# Patient Record
Sex: Male | Born: 1960 | Race: White | Hispanic: No | Marital: Married | Smoking: Never smoker
Health system: Southern US, Community
[De-identification: ages and names within clinical notes are randomized; demographics above are authoritative.]

## PROBLEM LIST (undated history)

## (undated) DIAGNOSIS — E119 Type 2 diabetes mellitus without complications: Secondary | ICD-10-CM

## (undated) DIAGNOSIS — I10 Essential (primary) hypertension: Secondary | ICD-10-CM

## (undated) DIAGNOSIS — C61 Malignant neoplasm of prostate: Secondary | ICD-10-CM

## (undated) DIAGNOSIS — E291 Testicular hypofunction: Secondary | ICD-10-CM

## (undated) DIAGNOSIS — Z86718 Personal history of other venous thrombosis and embolism: Secondary | ICD-10-CM

## (undated) DIAGNOSIS — G4730 Sleep apnea, unspecified: Secondary | ICD-10-CM

## (undated) DIAGNOSIS — R0602 Shortness of breath: Secondary | ICD-10-CM

## (undated) DIAGNOSIS — R972 Elevated prostate specific antigen [PSA]: Secondary | ICD-10-CM

## (undated) DIAGNOSIS — M549 Dorsalgia, unspecified: Secondary | ICD-10-CM

## (undated) DIAGNOSIS — M2550 Pain in unspecified joint: Secondary | ICD-10-CM

## (undated) DIAGNOSIS — I2699 Other pulmonary embolism without acute cor pulmonale: Secondary | ICD-10-CM

## (undated) DIAGNOSIS — E785 Hyperlipidemia, unspecified: Secondary | ICD-10-CM

## (undated) DIAGNOSIS — R600 Localized edema: Secondary | ICD-10-CM

## (undated) HISTORY — DX: Essential (primary) hypertension: I10

## (undated) HISTORY — PX: WRIST SURGERY: SHX841

## (undated) HISTORY — DX: Type 2 diabetes mellitus without complications: E11.9

## (undated) HISTORY — DX: Hyperlipidemia, unspecified: E78.5

## (undated) HISTORY — DX: Shortness of breath: R06.02

## (undated) HISTORY — DX: Localized edema: R60.0

## (undated) HISTORY — DX: Pain in unspecified joint: M25.50

## (undated) HISTORY — PX: JOINT REPLACEMENT: SHX530

## (undated) HISTORY — DX: Personal history of other venous thrombosis and embolism: Z86.718

## (undated) HISTORY — DX: Elevated prostate specific antigen (PSA): R97.20

## (undated) HISTORY — DX: Dorsalgia, unspecified: M54.9

## (undated) HISTORY — DX: Malignant neoplasm of prostate: C61

## (undated) HISTORY — DX: Other pulmonary embolism without acute cor pulmonale: I26.99

## (undated) HISTORY — DX: Testicular hypofunction: E29.1

## (undated) HISTORY — DX: Sleep apnea, unspecified: G47.30

## (undated) HISTORY — DX: Morbid (severe) obesity due to excess calories: E66.01

## (undated) HISTORY — PX: FOREARM SURGERY: SHX651

## (undated) HISTORY — PX: SHOULDER SURGERY: SHX246

---

## 2012-12-20 DIAGNOSIS — I2699 Other pulmonary embolism without acute cor pulmonale: Secondary | ICD-10-CM

## 2012-12-20 HISTORY — DX: Other pulmonary embolism without acute cor pulmonale: I26.99

## 2013-05-16 ENCOUNTER — Inpatient Hospital Stay: Payer: Self-pay | Admitting: Internal Medicine

## 2013-05-16 LAB — COMPREHENSIVE METABOLIC PANEL
Anion Gap: 7 (ref 7–16)
Chloride: 106 mmol/L (ref 98–107)
Co2: 28 mmol/L (ref 21–32)
EGFR (Non-African Amer.): >60
Osmolality: 283 (ref 275–301)
SGPT (ALT): 38 U/L (ref 12–78)
Total Protein: 6.9 g/dL (ref 6.4–8.2)

## 2013-05-16 LAB — PRO B NATRIURETIC PEPTIDE: B-Type Natriuretic Peptide: 65 pg/mL (ref 0–125)

## 2013-05-16 LAB — CBC
HCT: 42.3 % (ref 40.0–52.0)
MCH: 29.1 pg (ref 26.0–34.0)
MCV: 86 fL (ref 80–100)
Platelet: 214 10*3/uL (ref 150–440)
RBC: 4.91 10*6/uL (ref 4.40–5.90)
RDW: 14.4 % (ref 11.5–14.5)
WBC: 11.9 10*3/uL — ABNORMAL HIGH (ref 3.8–10.6)

## 2013-05-16 LAB — TROPONIN I: Troponin-I: 0.06 ng/mL — ABNORMAL HIGH

## 2013-05-16 LAB — PROTIME-INR
INR: 1.1
Prothrombin Time: 14.1 secs (ref 11.5–14.7)

## 2013-05-16 LAB — CK TOTAL AND CKMB (NOT AT ARMC)
CK, Total: 90 U/L (ref 35–232)
CK, Total: 84 U/L (ref 35–232)
CK-MB: 0.9 ng/mL (ref 0.5–3.6)

## 2013-05-16 LAB — APTT: Activated PTT: >160 secs (ref 23.6–35.9)

## 2013-05-17 LAB — BASIC METABOLIC PANEL
Anion Gap: 6 — ABNORMAL LOW (ref 7–16)
BUN: 15 mg/dL (ref 7–18)
Calcium, Total: 8.4 mg/dL — ABNORMAL LOW (ref 8.5–10.1)
Chloride: 103 mmol/L (ref 98–107)
Creatinine: 1.36 mg/dL — ABNORMAL HIGH (ref 0.60–1.30)
EGFR (African American): >60
EGFR (Non-African Amer.): >60
Glucose: 105 mg/dL — ABNORMAL HIGH (ref 65–99)
Osmolality: 282 (ref 275–301)

## 2013-05-17 LAB — PROTIME-INR
INR: 1.1
Prothrombin Time: 14.6 secs (ref 11.5–14.7)

## 2013-05-17 LAB — CBC WITH DIFFERENTIAL/PLATELET
Basophil #: 0 10*3/uL (ref 0.0–0.1)
Basophil %: 0.4 %
HGB: 13.4 g/dL (ref 13.0–18.0)
MCHC: 34.7 g/dL (ref 32.0–36.0)
MCV: 87 fL (ref 80–100)
Monocyte %: 9.9 %
Neutrophil #: 6.2 10*3/uL (ref 1.4–6.5)
Neutrophil %: 67.8 %
Platelet: 197 10*3/uL (ref 150–440)

## 2013-05-17 LAB — TROPONIN I: Troponin-I: 0.17 ng/mL — ABNORMAL HIGH

## 2013-05-17 LAB — CK TOTAL AND CKMB (NOT AT ARMC)
CK, Total: 75 U/L (ref 35–232)
CK-MB: 2 ng/mL (ref 0.5–3.6)

## 2013-05-18 LAB — PROTIME-INR
INR: 1.3
Prothrombin Time: 15.9 secs — ABNORMAL HIGH (ref 11.5–14.7)

## 2013-05-19 LAB — PROTIME-INR
INR: 1.7
Prothrombin Time: 20.1 secs — ABNORMAL HIGH (ref 11.5–14.7)

## 2013-05-20 LAB — PROTIME-INR
INR: 1.9
INR: 1.9
Prothrombin Time: 21.6 secs — ABNORMAL HIGH (ref 11.5–14.7)
Prothrombin Time: 21.4 secs — ABNORMAL HIGH (ref 11.5–14.7)

## 2013-05-22 LAB — CULTURE, BLOOD (SINGLE)

## 2015-04-11 NOTE — H&P (Signed)
PATIENT NAME:  Dean [name], ACKLIN MR#:  [id number] DATE OF BIRTH:  06-Jan-1961  DATE OF ADMISSION:  05/16/2013  PRIMARY DOCTOR:  Dr. Yves Dill.     ER PHYSICIAN: Dr. Mariea Clonts.   CHIEF COMPLAINT: Shortness of breath.  HISTORY OF PRESENT ILLNESS:  This is a 54 year old obese male with history of hypogonadism on testosterone pill who came in because of sudden onset of shortness of breath.  Shortness of breath started this morning. The patient woke up with shortness of breath and wheezing and cough, unable to catch the breath.  In the ER, the patient's oxygen saturation was 84% on arrival on 2 liters, and he is saturating at around 95 on 5 liters.  No chest pain. No fever. No recent history of cold or cough. No recent history of travel on a long flight.  No extremity edema. No calf pain. The patient's CAT scan of the chest showed bilateral pulmonary emboli and we are admitting him for PE.   PAST MEDICAL HISTORY: Significant for hypogonadism. The patient is on testosterone pellets which were inserted by Dr. Yves Dill 2 weeks ago. The patient denies any other medical problems.  ALLERGIES: PENICILLIN.   SOCIAL HISTORY: Occasional drinking. No smoking. The patient is a Careers adviser at [employer] and he is in the process of moving to [place] next week to start a new job, but the patient is very active. Denies any recent sedentary habits.   PAST SURGICAL HISTORY:  shoulder surgery on the right shoulder and also left elbow.   FAMILY HISTORY: No history of blood clots.  Father had hypertension.  MEDICATION:  None except the testosterone pellets which were inserted 3 to 4 weeks ago.   REVIEW OF SYSTEMS: GENERAL:  The patient denies any fever or fatigue.   EYES: No blurred vision.   ENT: No tinnitus. No epistaxis. No difficulty swallowing.   RESPIRATIONS:  The patient had sudden onset of shortness of breath and also some cough and wheezing this morning. No hemoptysis. No history of chronic obstructive  pulmonary disease.   CARDIOVASCULAR: No chest pain. No orthopnea. No PND, no pedal edema. No palpitations.   GASTROINTESTINAL: No nausea. No vomiting.   GENITOURINARY: No dysuria.   ENDOCRINE: No polyuria or nocturia.   HEMATOLOGIC: No anemia or easy bruising.   INTEGUMENTARY: No skin rashes.   MUSCULOSKELETAL: No joint pain.   NEUROLOGIC: No numbness or weakness.   PSYCHIATRIC: No anxiety or insomnia.   PHYSICAL EXAMINATION: GENERAL: This is a 54 year old male who is well developed, well nourished.   His BMI is 43.9 indicating he is morbidly obese.  VITAL SIGNS:  Temperature 97.9, heart rate 133 on arrival, respiration 26. The patient's blood pressure 165/122, sats initially 84% on room air. Right now, he is on 5 liters, saturating around 95%.   HEENT:  Head normocephalic, atraumatic. Eyes, EOM are intact. No conjunctival pallor. Nose, no lesions. No drainage.   EARS: No drainage. Mouth no lesions. No exudates.   NECK: Supple. No JVD. No masses. No lymphadenopathy. Thyroid is in the midline.   RESPIRATORY: Clear to auscultation except faint expiratory wheeze in the left upper lobe and the patient is not using accessory muscles of respiration.   CARDIOVASCULAR: S1 and S2 regular, tachycardic. Pulses equal at carotid and femoral and pedal pulses. No pedal edema.   GASTROINTESTINAL: Abdomen is soft, nontender, nondistended. Bowel sounds present. No organomegaly.   SKIN: Normal, well hydrated.   MUSCULOSKELETAL: The patient has no joint effusion and no tenderness and  range of motion is adequate.   NEUROLOGIC: Cranial nerves II through XII intact. Deep tendon reflexes 2+ bilaterally.  Sensations are intact.   PSYCHIATRIC: Mood and affect are within normal limits.   LABORATORY, DIAGNOSTIC AND RADIOLOGICAL DATA: TEX upper lobe.  Chest x-ray is concerning for atelectasis versus infiltrate in the left mid hemithorax. WBC 11.9, hemoglobin 14.3, hematocrit 42.3, and platelets 214.  Troponin 0.06.  Electrolytes: Sodium 141, potassium 3.6, chloride 106, bicarbonate 28, BUN is 14, creatinine 1.25, glucose 129. BNP 65. EKG sinus tach with 130 beats per minute. No ST-T changes.   ASSESSMENT AND PLAN: A 54 year old male with sudden onset of shortness of breath with hypoxia on room air and tachycardia and CT chest showing pulmonary emboli. Admit to hospitalist service, start the patient on Lovenox, Coumadin and also continue oxygen. Discussed the risks and benefits of anticoagulation. The patient is recently started on testosterone supplements probably his thromboembolic events likely related to the testosterone use.  We will have urology, Dr. Yves Dill, see the patient and probably will have to discontinue that. We will also get a hypercoagulation workup with protein C, protein S, antithrombin III.  Check the lower extremity ultrasound as well.   Possible pneumonia with leukocytosis. The patient received Levaquin in the ER. We will continue Levaquin and follow the trend.  We will get the pulmonary consult as the CT chest is  concerning for possible malignancy.   TIME SPENT: About 60 minutes.   Discussed the plan with the patient and patient's wife.    ____________________________ Epifanio Lesches, MD sk:rw D: 05/16/2013 14:10:00 ET T: 05/16/2013 15:12:12 ET JOB#: [id number]  cc: Otelia Limes. Yves Dill, MD Epifanio Lesches, MD, <Dictator> Epifanio Lesches MD ELECTRONICALLY SIGNED 07/02/2013 19:39

## 2015-04-11 NOTE — Consult Note (Signed)
Brief Consult Note: Diagnosis: Elevated TNI.   Patient was seen by consultant.   Consult note dictated.   Orders entered.   Comments: Patient presented with acute SOB, decreased O2sats, tachycardia and found to have PE-now on warfarin and Lovenox. His mildly elevated TNI likely are from demand ischemia from repsiratory distress and not from ischemia. Will get echo to check wall motion and make further recommendations after reviewing results.  Electronic Signatures: Angelica Ran (MD)   (Signed 31-May-14 10:45)  Co-Signer: Brief Consult Note Merla Riches (PA-C)   (Signed 29-May-14 08:43)  Authored: Brief Consult Note  Last Updated: 31-May-14 10:45 by Angelica Ran (MD)

## 2015-04-11 NOTE — Consult Note (Signed)
PATIENT NAME:  Dean [name], Dean [name] MR#:  [id number] DATE OF BIRTH:  Dec 11, 1961  DATE OF CONSULTATION:  05/17/2013  REFERRING PHYSICIAN:  Epifanio Lesches, MD CONSULTING PHYSICIAN:  Merla Riches, PA-C  PRIMARY CARE PHYSICIAN:  Calla Kicks, MD  REASON FOR CONSULTATION: Elevated troponin I.  HISTORY OF PRESENT ILLNESS: Mr. Dean [name] is a 54 year old obese male with a history of hypogonadism on testosterone supplementation who came in with sudden shortness of breath. The patient states he has had increased shortness of breath with wheezing at night, coughing and could not get his breath. He did not have any exertional chest pain prior to this episode and denies any orthopnea, PND, edema, palpitations, jaw pain, arm pain, fainting/syncopal episodes or dizziness. He has never had any significant cardiac evaluations. When he had CT scan here he was found to have pulmonary emboli (bilateral).   PAST MEDICAL HISTORY: Hypogonadism.   PAST SURGICAL HISTORY: Multiple orthopedic surgeries on the right shoulder, left elbow.   ALLERGIES: PENICILLIN.   HOME MEDICATIONS: Testosterone pellets inserted 3 to 4 weeks ago.   FAMILY HISTORY: Father had a history of CVA, hypertension, prostate cancer.   SOCIAL HISTORY: The patient works at [employer].  Denies any significant alcohol use. Denies drug use.   REVIEW OF SYSTEMS:  GENERAL: The patient denies any fever, fatigue.  EYES: No blurred vision.  ENT:  The patient denies any tinnitus, epistaxis. RESPIRATORY:  The patient with shortness of breath, coughing and wheezing.  CARDIOVASCULAR: The patient denies any exertional chest pain, palpitations. GASTROINTESTINAL:  The patient denies any nausea, vomiting, abdominal pain.   PHYSICAL EXAMINATION: GENERAL: This is a 54 year old white male who is well-developed, well-nourished and overweight.  VITAL SIGNS: Temperature 97.9 degrees Fahrenheit, heart rate is 101, respiratory rate is 20, blood pressure  101/73 and O2 sats 96% on 5 L/min.  HEENT: Head atraumatic, normocephalic. Eyes:  Pupils are round and equal. Conjunctivae pale, pink. There is no scleral icterus. Ears and nose are normal to external inspection. Mouth:  Good dentition. Moist mucous membranes.  NECK: Is supple. Trachea is midline. Thyroid smooth. There are no carotid bruits.  LUNGS: Clear to auscultation, but diminished due to the patient's girth.  HEART:  Regular rate and rhythm with some tachycardia noted.  EXTREMITIES: Trace pedal edema. The patient has TED stockings.   ANCILLARY DATA: EKG on admission was sinus tachycardia, 130 beats per minute, nonspecific ST-T changes.   CT of the chest for PE: With bilateral pulmonary emboli, possible underlying infiltrate versus malignancy in the posterior left lateral lobe inferior along the major fissure.   Doppler ultrasound of the lower extremities:  No evidence of DVT.  LABORATORY DATA: Glucose is 105, BUN 15, creatinine 1.36, sodium 141, potassium 3.9, chloride 103, CO2 is 32.  Estimated GFR is greater than 60. Total CK is 75, CK-MB is 2.0.  Troponin I on admission is 0.06, second was 0.31 and third was 0.17. White blood cell count is 9.1, hemoglobin 13.4, hematocrit 38.6, platelet count 197,000, MCV is 87. PT is 14.6. INR is 1.1.  Activated PTT is greater than 160.   ASSESSMENT AND PLAN: 1.  Elevated troponin I. The patient's elevations in troponin are most likely secondary to demand ischemia from respiratory distress since he has bilateral pulmonary emboli.  The patient denies any exertional chest pain at this time and EKG with no acute changes. Echocardiogram will be ordered to assess wall motion and further recommendations will be made at that time. The patient will  most likely need outpatient stress testing after his acute respiratory symptoms have resolved.  2.  Bilateral pulmonary emboli.  The patient is currently on anticoagulation with Lovenox and warfarin. Ultrasound of the  lower extremities are negative for deep vein thrombosis bilaterally. Hypercoagulability work-up has already been ordered.   Thank you very much for this consultation and allowing [place] to participate in this patient's care. We will continue to follow this patient with you.   ____________________________ Merla Riches, PA-C mam:sb D: 05/17/2013 09:06:49 ET T: 05/17/2013 09:59:34 ET JOB#: [id number]  cc: Merla Riches, PA-C, <Dictator> Dory Horn. Eliberto Ivory, [place] PA ELECTRONICALLY SIGNED 05/17/2013 11:27

## 2015-04-11 NOTE — Discharge Summary (Signed)
PATIENT NAME:  Dean [name], Dean [name] MR#:  [id number] DATE OF BIRTH:  10/26/1961  DATE OF ADMISSION:  05/16/2013 DATE OF DISCHARGE:  05/20/2013  DISCHARGE DIAGNOSES:  1.  Acute respiratory failure secondary to bilateral pulmonary emboli.  2.  Hypertension.  3.  Hypogonadism.   CONSULTATIONS: Pulmonology consult with Dr. Raul Del.   DISCHARGE MEDICATIONS: 1.  Metoprolol 25 mg p.o. twice daily. 2.  Levaquin 500 mg p.o. daily for 5 days.  3.  Coumadin 10 mg p.o. daily.   The patient has appointment with Dr. Hortencia Pilar on Tuesday, June 3rd at 1 p.m. He will need to follow up regarding PT and INR and adjustment of warfarin. The patient also needs to follow up with Dr. Raul Del as an outpatient regarding his CT chest findings and probably repeat a CT, but the patient is in the process of moving to [place].   HOSPITAL COURSE:  1.  [age]-year-old obese male with a history of hypogonadism, came in because of onset of shortness of breath with pleuritic chest pain, oxygen saturations 84% on room air when he came. The patient's CT chest showed pulmonary emboli bilaterally, and the patient was started on Lovenox and Coumadin. Admitted to telemetry. Patient's past medical history is significant for a history of testosterone pellets, placed by Dr. Yves Dill 2 weeks ago. The patient was started on Lovenox and Coumadin after discussing the risks and benefits. INR today is 1.9, but he has been getting Lovenox 1 mg/kg body weight, and he is on Levaquin, so hopefully the INR will jump to more than 2 tomorrow. The patient was really eager to go home, so he got the morning dose of Lovenox shot and I discharged him with Coumadin 10 mg. The patient initially was hypoxic, needing 5 liters of oxygen but gradually he was back to room air and oxygenation is 95%. Patient's lower extremity ultrasound did not show any DVT. Thought that moderate obesity and also the testosterone pellets probably are the risk factors for his DVT.  Hypercoagulable workup, including antithrombin III, protein C are within normal range.  2.  Hypertension: No prior history of high blood pressure. Blood pressure has been running around 154/90, and the patient was started on metoprolol 25 mg twice daily and advised to continue that.  3.  Possible pneumonia on the chest CAT scan: Initially the white count was slightly up at 11.9. The patient's blood cultures have been negative. Chest x-ray concerning for atelectasis versus infiltrate in the left mid-hemithorax so we started him on Levaquin. Dr. Raul Del saw the patient. Patient's CT chest showed bilateral PE's with slightly greater amount on the right, especially in the middle lobe, and also possible underlying infiltrate> in the posterolateral left upper lobe inferiorly around a major fissure. Dr. Raul Del thought it was pneumonia and the patient needs to follow up with him as an outpatient for a repeat chest CT. The patient has noticed no risk factors except obesity. He is not a smoker. No family history of cancers.  4.  Elevated troponins, likely secondary to demand ischemia from PE: The patient's troponin was up at 0.31 on admission, with normal CK and CPK-MB. The patient had no chest pain after starting Lovenox and Coumadin. He had an echocardiogram done which showed an EF more than 55%. Second troponin dropped to 0.17.   CONDITION AT THE TIME OF DISCHARGE: Stable.   The patient was able to go home. Explained that he needs to follow up with Dr. Hortencia Pilar  on Tuesday regarding INR  and Coumadin adjustment. The patient is in the process of moving to [place] for a new job, and he understands about the complaints and also getting set up with a  new doctor there, and Dr. Yves Dill also said testosterone pellets usually last for 3 months, and the patient's testosterone effect should clear away in 3 months, and he did not recommend taking them off because he is already on anticoagulants, taking th implants they  cause significant bleeding, and anyway he was started on anticoagulation. The patient understands that and we left those testosterone pellets there in his body.  The patient's wife said they tried the AndroGel, and other topical products did not help him with hypogonadism.   Time spent on discharge preparation:  More than 30 minutes.   ____________________________ Epifanio Lesches, MD sk:dm D: 05/20/2013 19:27:37 ET T: 05/21/2013 07:30:40 ET JOB#: [id number]  cc: Epifanio Lesches, MD, <Dictator> Epifanio Lesches MD ELECTRONICALLY SIGNED 05/27/2013 13:22

## 2016-01-05 ENCOUNTER — Ambulatory Visit (INDEPENDENT_AMBULATORY_CARE_PROVIDER_SITE_OTHER): Payer: BC Managed Care – PPO | Admitting: Family Medicine

## 2016-01-05 ENCOUNTER — Encounter: Payer: Self-pay | Admitting: Family Medicine

## 2016-01-05 VITALS — BP 144/92 | Ht 78.0 in | Wt >= 6400 oz

## 2016-01-05 DIAGNOSIS — Z7189 Other specified counseling: Secondary | ICD-10-CM

## 2016-01-05 DIAGNOSIS — I10 Essential (primary) hypertension: Secondary | ICD-10-CM

## 2016-01-05 DIAGNOSIS — Z7689 Persons encountering health services in other specified circumstances: Secondary | ICD-10-CM

## 2016-01-05 LAB — COMPREHENSIVE METABOLIC PANEL
ALBUMIN: 4.1 g/dL (ref 3.6–5.1)
ALT: 32 U/L (ref 9–46)
AST: 21 U/L (ref 10–35)
Alkaline Phosphatase: 64 U/L (ref 40–115)
BUN: 17 mg/dL (ref 7–25)
CHLORIDE: 99 mmol/L (ref 98–110)
CO2: 28 mmol/L (ref 20–31)
CREATININE: 1.14 mg/dL (ref 0.70–1.33)
Calcium: 9.1 mg/dL (ref 8.6–10.3)
Glucose, Bld: 91 mg/dL (ref 65–99)
POTASSIUM: 3.7 mmol/L (ref 3.5–5.3)
SODIUM: 137 mmol/L (ref 135–146)
Total Bilirubin: 0.6 mg/dL (ref 0.2–1.2)
Total Protein: 6.1 g/dL (ref 6.1–8.1)

## 2016-01-05 MED ORDER — HYDROCHLOROTHIAZIDE 25 MG PO TABS: 25.0000 mg | ORAL_TABLET | Freq: Every day | ORAL | Status: DC

## 2016-01-05 NOTE — Progress Notes (Signed)
   Subjective:    Patient ID: Dean [name], male    DOB: Oct 13, 1961, 55 y.o.   MRN: [id number]  HPI Chief Complaint  Patient presents with  . new pt    new pt get est. needs bp med refills and would like blood work.    He is new to the practice and here to establish care. He is also here for an acute problem. He has history of hypertension and has been out of blood pressure medication for about 6 months. He reports having occasional headaches and is curious if this is related to his blood pressure being elevated.  Past medical history includes multiple orthopedic injuries and shoulder surgeries including a total shoulder. Also reports history of PE, thinks it was from blockage and testicle procedure in 2014. No issues since.  Last physical exam was January 2014. Last fasting blood work then also.  Denies fever, chills, fatigue, dizziness, chest pain, palpitations, DOE, LE edema.   Denies smoking, occasional alcohol, denies drug use.    Review of Systems Pertinent positives and negatives in the history of present illness.    Objective:   Physical Exam BP 144/92 mmHg  Ht 6\' 6"  (1.981 m)  Wt 435 lb 12.8 oz (197.678 kg)  BMI 50.37 kg/m2  Alert and in no distress. Tympanic membranes and canals are normal. Pharyngeal area is normal. Neck is supple without adenopathy or thyromegaly. Cardiac exam shows a regular sinus rhythm without murmurs or gallops. Lungs are clear to auscultation.      Assessment & Plan:  Essential hypertension - Plan: Comprehensive metabolic panel  Encounter to establish care  Discussed lifestyle modifications to reduce blood pressure including DASH diet, eating healthy diet, increasing physical activity. HCTZ refilled, he states this worked well controlling his blood pressure in past but he has been without medication for 6 months at least. Recommend as he is restarting his medication that he be aware of any side effects for the first few days. Will check kidney  function today.  He will return next week for nurse visit to have his blood pressure checked. Encouraged him to schedule an appointment within the next 2 weeks for complete physical exam and fasting blood work.

## 2016-01-05 NOTE — Patient Instructions (Addendum)
Return within the next 2 weeks for a complete physical exam and fasting blood work so that we may also check your blood pressure. Since you are restarting your blood pressure medication, you should be aware of any side effects and let us know if you are experiencing low blood pressure, lightheaded or dizziness. You may want to change position slowly for the  first 2 or 3 days that you are taking the medication. You may notice an increase in urination so make sure you are staying well hydrated.   DASH Eating Plan DASH stands for "Dietary Approaches to Stop Hypertension." The DASH eating plan is a healthy eating plan that has been shown to reduce high blood pressure (hypertension). Additional health benefits may include reducing the risk of type 2 diabetes mellitus, heart disease, and stroke. The DASH eating plan may also help with weight loss. WHAT DO I NEED TO KNOW ABOUT THE DASH EATING PLAN? For the DASH eating plan, you will follow these general guidelines:  Choose foods with a percent daily value for sodium of less than 5% (as listed on the food label).  Use salt-free seasonings or herbs instead of table salt or sea salt.  Check with your health care provider or pharmacist before using salt substitutes.  Eat lower-sodium products, often labeled as "lower sodium" or "no salt added."  Eat fresh foods.  Eat more vegetables, fruits, and low-fat dairy products.  Choose whole grains. Look for the word "whole" as the first word in the ingredient list.  Choose fish and skinless chicken or [place] more often than red meat. Limit fish, poultry, and meat to 6 oz (170 g) each day.  Limit sweets, desserts, sugars, and sugary drinks.  Choose heart-healthy fats.  Limit cheese to 1 oz (28 g) per day.  Eat more home-cooked food and less restaurant, buffet, and fast food.  Limit fried foods.  Cook foods using methods other than frying.  Limit canned vegetables. If you do use them, rinse them well  to decrease the sodium.  When eating at a restaurant, ask that your food be prepared with less salt, or no salt if possible. WHAT FOODS CAN I EAT? Seek help from a dietitian for individual calorie needs. Grains Whole grain or whole wheat bread. Brown rice. Whole grain or whole wheat pasta. Quinoa, bulgur, and whole grain cereals. Low-sodium cereals. Corn or whole wheat flour tortillas. Whole grain cornbread. Whole grain crackers. Low-sodium crackers. Vegetables Fresh or frozen vegetables (raw, steamed, roasted, or grilled). Low-sodium or reduced-sodium tomato and vegetable juices. Low-sodium or reduced-sodium tomato sauce and paste. Low-sodium or reduced-sodium canned vegetables.  Fruits All fresh, canned (in natural juice), or frozen fruits. Meat and Other Protein Products Ground beef (85% or leaner), grass-fed beef, or beef trimmed of fat. Skinless chicken or [place]. Ground chicken or [place]. Pork trimmed of fat. All fish and seafood. Eggs. Dried beans, peas, or lentils. Unsalted nuts and seeds. Unsalted canned beans. Dairy Low-fat dairy products, such as skim or 1% milk, 2% or reduced-fat cheeses, low-fat ricotta or cottage cheese, or plain low-fat yogurt. Low-sodium or reduced-sodium cheeses. Fats and Oils Tub margarines without trans fats. Light or reduced-fat mayonnaise and salad dressings (reduced sodium). Avocado. Safflower, olive, or canola oils. Natural peanut or almond butter. Other Unsalted popcorn and pretzels. The items listed above may not be a complete list of recommended foods or beverages. Contact your dietitian for more options. WHAT FOODS ARE NOT RECOMMENDED? Grains White bread. White pasta. White rice. Refined cornbread.  Bagels and croissants. Crackers that contain trans fat. Vegetables Creamed or fried vegetables. Vegetables in a cheese sauce. Regular canned vegetables. Regular canned tomato sauce and paste. Regular tomato and vegetable juices. Fruits Dried fruits.  Canned fruit in light or heavy syrup. Fruit juice. Meat and Other Protein Products Fatty cuts of meat. Ribs, chicken wings, bacon, sausage, bologna, salami, chitterlings, fatback, hot dogs, bratwurst, and packaged luncheon meats. Salted nuts and seeds. Canned beans with salt. Dairy Whole or 2% milk, cream, half-and-half, and cream cheese. Whole-fat or sweetened yogurt. Full-fat cheeses or blue cheese. Nondairy creamers and whipped toppings. Processed cheese, cheese spreads, or cheese curds. Condiments Onion and garlic salt, seasoned salt, table salt, and sea salt. Canned and packaged gravies. Worcestershire sauce. Tartar sauce. Barbecue sauce. Teriyaki sauce. Soy sauce, including reduced sodium. Steak sauce. Fish sauce. Oyster sauce. Cocktail sauce. Horseradish. Ketchup and mustard. Meat flavorings and tenderizers. Bouillon cubes. Hot sauce. Tabasco sauce. Marinades. Taco seasonings. Relishes. Fats and Oils Butter, stick margarine, lard, shortening, ghee, and bacon fat. Coconut, palm kernel, or palm oils. Regular salad dressings. Other Pickles and olives. Salted popcorn and pretzels. The items listed above may not be a complete list of foods and beverages to avoid. Contact your dietitian for more information. WHERE CAN I FIND MORE INFORMATION? National Heart, Lung, and Blood Institute: travelstabloid.com   This information is not intended to replace advice given to you by your health care provider. Make sure you discuss any questions you have with your health care provider.   Document Released: 11/25/2011 Document Revised: 12/27/2014 Document Reviewed: 10/10/2013 Elsevier Interactive Patient Education [employer].

## 2016-01-22 ENCOUNTER — Encounter: Payer: Self-pay | Admitting: Family Medicine

## 2016-01-22 ENCOUNTER — Ambulatory Visit (INDEPENDENT_AMBULATORY_CARE_PROVIDER_SITE_OTHER): Payer: BC Managed Care – PPO | Admitting: Family Medicine

## 2016-01-22 VITALS — BP 144/88 | HR 68 | Ht 78.0 in | Wt >= 6400 oz

## 2016-01-22 DIAGNOSIS — Z1211 Encounter for screening for malignant neoplasm of colon: Secondary | ICD-10-CM | POA: Diagnosis not present

## 2016-01-22 DIAGNOSIS — Z8349 Family history of other endocrine, nutritional and metabolic diseases: Secondary | ICD-10-CM

## 2016-01-22 DIAGNOSIS — Z8042 Family history of malignant neoplasm of prostate: Secondary | ICD-10-CM | POA: Diagnosis not present

## 2016-01-22 DIAGNOSIS — N528 Other male erectile dysfunction: Secondary | ICD-10-CM | POA: Diagnosis not present

## 2016-01-22 DIAGNOSIS — Z125 Encounter for screening for malignant neoplasm of prostate: Secondary | ICD-10-CM

## 2016-01-22 DIAGNOSIS — E291 Testicular hypofunction: Secondary | ICD-10-CM

## 2016-01-22 DIAGNOSIS — Z0000 Encounter for general adult medical examination without abnormal findings: Secondary | ICD-10-CM

## 2016-01-22 DIAGNOSIS — R972 Elevated prostate specific antigen [PSA]: Secondary | ICD-10-CM

## 2016-01-22 DIAGNOSIS — Z86711 Personal history of pulmonary embolism: Secondary | ICD-10-CM | POA: Diagnosis not present

## 2016-01-22 DIAGNOSIS — I10 Essential (primary) hypertension: Secondary | ICD-10-CM | POA: Diagnosis not present

## 2016-01-22 DIAGNOSIS — Z23 Encounter for immunization: Secondary | ICD-10-CM

## 2016-01-22 DIAGNOSIS — G479 Sleep disorder, unspecified: Secondary | ICD-10-CM

## 2016-01-22 DIAGNOSIS — R7989 Other specified abnormal findings of blood chemistry: Secondary | ICD-10-CM

## 2016-01-22 DIAGNOSIS — I152 Hypertension secondary to endocrine disorders: Secondary | ICD-10-CM | POA: Insufficient documentation

## 2016-01-22 LAB — POCT URINALYSIS DIPSTICK
BILIRUBIN UA: NEGATIVE
GLUCOSE UA: NEGATIVE
Ketones, UA: NEGATIVE
Leukocytes, UA: NEGATIVE
Nitrite, UA: NEGATIVE
Protein, UA: NEGATIVE
RBC UA: NEGATIVE
Urobilinogen, UA: NEGATIVE
pH, UA: 6

## 2016-01-22 LAB — CBC WITH DIFFERENTIAL/PLATELET
Basophils Absolute: 0 10*3/uL (ref 0.0–0.1)
Basophils Relative: 0 % (ref 0–1)
EOS ABS: 0.1 10*3/uL (ref 0.0–0.7)
EOS PCT: 2 % (ref 0–5)
HEMATOCRIT: 42.9 % (ref 39.0–52.0)
Hemoglobin: 14 g/dL (ref 13.0–17.0)
LYMPHS ABS: 1.1 10*3/uL (ref 0.7–4.0)
LYMPHS PCT: 21 % (ref 12–46)
MCH: 28.7 pg (ref 26.0–34.0)
MCHC: 32.6 g/dL (ref 30.0–36.0)
MCV: 88.1 fL (ref 78.0–100.0)
MONO ABS: 0.5 10*3/uL (ref 0.1–1.0)
MPV: 11.4 fL (ref 8.6–12.4)
Monocytes Relative: 9 % (ref 3–12)
Neutro Abs: 3.7 10*3/uL (ref 1.7–7.7)
Neutrophils Relative %: 68 % (ref 43–77)
PLATELETS: 223 10*3/uL (ref 150–400)
RBC: 4.87 MIL/uL (ref 4.22–5.81)
RDW: 13.7 % (ref 11.5–15.5)
WBC: 5.4 10*3/uL (ref 4.0–10.5)

## 2016-01-22 LAB — TSH: TSH: 3.385 u[IU]/mL (ref 0.350–4.500)

## 2016-01-22 LAB — LIPID PANEL
CHOL/HDL RATIO: 4.9 ratio (ref <=5.0)
Cholesterol: 239 mg/dL — ABNORMAL HIGH (ref 125–200)
HDL: 49 mg/dL (ref >=40)
LDL Cholesterol: 163 mg/dL — ABNORMAL HIGH (ref <130)
Triglycerides: 137 mg/dL (ref <150)
VLDL: 27 mg/dL (ref <30)

## 2016-01-22 MED ORDER — LISINOPRIL-HYDROCHLOROTHIAZIDE 10-12.5 MG PO TABS: 1.0000 | ORAL_TABLET | Freq: Every day | ORAL | Status: DC

## 2016-01-22 NOTE — Progress Notes (Signed)
Subjective:    Patient ID: Dean [name], male    DOB: [birth year], 55 y.o.   MRN: [id number]  HPI Chief Complaint  Patient presents with  . fasting cpe    fasting cpe. no other concerns. will get flu shot today   He is here for complete physical exam and fasting blood work.  Has no complaints or concerns today. At our last visit he had been off his blood pressure medication for several months. Restarted him on HCTZ 25 mg and he reports taking this daily. States he cannot take beta blockers, had reaction with this medication in past, significant edema per patient.  Blood pressure at home has been 140s and 150s over 80s and 90s. Has tried to watch salt intake. Does not exercise.   Other providers : none  Has history of hypogonadism and tried topical testosterone in past without success. He had testosterone seed implants in 2014. Reports having bilateral PEs soon after. States unable to determine cause for blood clots and thought this could have been related to seed implants.  History of sleep disturbance and states he snores and has definite periods of sleep apnea. His wife has been telling him this for years. Reports daytime sleepiness. He has never had a sleep study. He feels certain he has sleep apnea but states he does not really want to have a sleep study and does not think he could wear a CPAP.   Reports difficulty maintaining an erection. Has not tried ED medication in past.  Denies urinary symptoms or problems with stream.  Has occasional numbness sensation down lateral right arm all the way to his pinky finger, this occurs when having his arm bent or driving with right arm on steering wheel. This has been going on for a few months. Numbness quickly goes away after changes positions with his arm. He does not want to have this worked up any further today but states he will let me know if it bothers him more.   Last eye exam: November 2016 Dentist - every 6 months, last in Dec  2016 Colonoscopy: never  PSA: last one in 2014, father with prostate cancer at 41   Never smoked. Drinks occasionally and denies drug use. 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 Has a son who is playing in the [employer].  Father had heart attack in 1s  3 sisters healthy 1 brother . History of thyroid disease with sisters.   Immunizations: flu today, tdap 2009   Reviewed allergies, medications, past medical, surgical, family, and social history.    Review of Systems Review of Systems Constitutional: -fever, -chills, -sweats, -unexpected weight change,-fatigue ENT: -runny nose, -ear pain, -sore throat Cardiology:  -chest pain, -palpitations, -edema Respiratory: -cough, -shortness of breath, -wheezing Gastroenterology: -abdominal pain, -nausea, -vomiting, -diarrhea, -constipation Hematology: -bleeding or bruising problems Musculoskeletal: +arthralgias, -myalgias, -joint swelling, -back pain Ophthalmology: -vision changes Urology: -dysuria, -difficulty urinating, -hematuria, -urinary frequency, -urgency Neurology: -headache, -weakness, -tingling, -numbness       Objective:   Physical Exam BP 144/88 mmHg  Pulse 68  Ht 6\' 6"  (1.981 m)  Wt 439 lb 9.6 oz (199.401 kg)  BMI 50.81 kg/m2  General Appearance:    Alert, cooperative, no distress, appears stated age  Head:    Normocephalic, without obvious abnormality, atraumatic  Eyes:    PERRL, conjunctiva/corneas clear, EOM's intact, fundi    benign  Ears:    Normal TM's and external ear canals  Nose:  Nares normal, mucosa normal, no drainage or sinus   tenderness  Throat:   Lips, mucosa, and tongue normal; teeth and gums normal  Neck:   Supple, no lymphadenopathy;  thyroid:  no   enlargement/tenderness/nodules; no carotid   bruit or JVD  Back:    Spine nontender, no curvature, ROM normal, no CVA     tenderness  Lungs:     Clear to auscultation bilaterally without wheezes, rales or     ronchi; respirations unlabored   Chest Wall:    No tenderness or deformity   Heart:    Regular rate and rhythm, S1 and S2 normal, no murmur, rub   or gallop  Breast Exam:    No chest wall tenderness, masses or gynecomastia  Abdomen:     Soft, non-tender, nondistended, normoactive bowel sounds,    no masses, no hepatosplenomegaly  Genitalia:    declined  Rectal:    Normal sphincter tone, no masses or tenderness; guaiac negative stool.  Able to only reach the tip of prostate, could not fully access the gland due to body habitus.   Extremities:   No clubbing, cyanosis or edema  Pulses:   2+ and symmetric all extremities  Skin:   Skin color, texture, turgor normal, no rashes or lesions  Lymph nodes:   Cervical, supraclavicular, and axillary nodes normal  Neurologic:   CNII-XII intact, normal strength, sensation and gait; reflexes 2+ and symmetric throughout          Psych:   Normal mood, affect, hygiene and grooming.    Urinalysis dipstick: negative EKG: Indication HTN. NSR. Read by myself and Dr. Redmond School.  epworth sleep scale: 14     Assessment & Plan:  Routine general medical examination at a health care facility - Plan: CBC with Differential/Platelet, EKG 12-Lead, PSA, POCT urinalysis dipstick, Lipid panel  Needs flu shot - Plan: Flu Vaccine QUAD 36+ mos IM  Essential hypertension - Plan: EKG 12-Lead  History of pulmonary embolism  Screening for colon cancer - Plan: Ambulatory referral to Gastroenterology  Family history of thyroid disease in sister - Plan: TSH  Family history of prostate cancer in father - Plan: PSA  Morbid obesity, unspecified obesity type ([place])  Hypogonadism in male - Plan: Testosterone  Other male erectile dysfunction - Plan: Testosterone  Sleep disturbance - Plan: Home sleep test  Discussed that his blood pressure is still not within goal <140/90. Will change his medication. Stop HCTZ 25 mg and start taking Lisinopril-HCTZ 10-12.5. Also discussed DASH diet and low sodium. Also  discussed that his probabe sleep apnea may be contributing to his HTN. Recommend watching calorie intake and increasing his physical activity slowly and increasing to the recommended 150 minutes per week. Discussed that lifestyle modifications will help him with weight loss, blood pressure control and sleep disturbance.  Will refer for home sleep study and suspect that he has sleep apnea. Discussed the possibility of a low dose sleep medication initially if he cannot tolerate the CPAP.  Referral made for colonoscopy.  Discussed that once we get his lab results we can revisit the idea of ED medication. Discussed elective PSA test and patient would like to keep an eye on this since his father had prostate cancer.  Flu shot given.  Spent a minimum of 25 minutes face to face counseling patient on sleep apnea, obesity, HTN, and ED in addition to preventive health care.  Baseline ECG was unremarkable and read by myself and Dr. Redmond School.  Recommend he  continue checking blood pressures at home and call me next week with his readings. Follow up in 1 month for follow up on HTN, sleep, and ED.

## 2016-01-22 NOTE — Patient Instructions (Signed)
Stop the HCTZ 25 mg tablets. Start taking the lisinopril-HCTZ 10-12.5 mg tablet, once daily. Keep an eye on your blood pressure and call me next Monday or Tuesday to let me know what your blood pressure readings are.  I am referring you for your screening colonoscopy. Let me know if you have not heard from them by next Thursday.  I am also ordering a home sleep study.  Preventative Care for Adults, Male       REGULAR HEALTH EXAMS:  A routine yearly physical is a good way to check in with your primary care provider about your health and preventive screening. It is also an opportunity to share updates about your health and any concerns you have, and receive a thorough all-over exam.   Most health insurance companies pay for at least some preventative services.  Check with your health plan for specific coverages.  WHAT PREVENTATIVE SERVICES DO MEN NEED?  Adult men should have their weight and blood pressure checked regularly.   Men age 47 and older should have their cholesterol levels checked regularly.  Beginning at age 31 and continuing to age 13, men should be screened for colorectal cancer.  Certain people should may need continued testing until age 31.  Other cancer screening may include exams for testicular and prostate cancer.  Updating vaccinations is part of preventative care.  Vaccinations help protect against diseases such as the flu.  Lab tests are generally done as part of preventative care to screen for anemia and blood disorders, to screen for problems with the kidneys and liver, to screen for bladder problems, to check blood sugar, and to check your cholesterol level.  Preventative services generally include counseling about diet, exercise, avoiding tobacco, drugs, excessive alcohol consumption, and sexually transmitted infections.    GENERAL RECOMMENDATIONS FOR GOOD HEALTH:  Healthy diet:  Eat a variety of foods, including fruit, vegetables, animal or vegetable protein,  such as meat, fish, chicken, and eggs, or beans, lentils, tofu, and grains, such as rice.  Drink plenty of water daily.  Decrease saturated fat in the diet, avoid lots of red meat, processed foods, sweets, fast foods, and fried foods.  Exercise:  Aerobic exercise helps maintain good heart health. At least 30-40 minutes of moderate-intensity exercise is recommended. For example, a brisk walk that increases your heart rate and breathing. This should be done on most days of the week.   Find a type of exercise or a variety of exercises that you enjoy so that it becomes a part of your daily life.  Examples are running, walking, swimming, water aerobics, and biking.  For motivation and support, explore group exercise such as aerobic class, spin class, Zumba, Yoga,or  martial arts, etc.    Set exercise goals for yourself, such as a certain weight goal, walk or run in a race such as a 5k walk/run.  Speak to your primary care provider about exercise goals.  Disease prevention:  If you smoke or chew tobacco, find out from your caregiver how to quit. It can literally save your life, no matter how long you have been a tobacco user. If you do not use tobacco, never begin.   Maintain a healthy diet and normal weight. Increased weight leads to problems with blood pressure and diabetes.   The Body Mass Index or BMI is a way of measuring how much of your body is fat. Having a BMI above 27 increases the risk of heart disease, diabetes, hypertension, stroke and other problems  related to obesity. Your caregiver can help determine your BMI and based on it develop an exercise and dietary program to help you achieve or maintain this important measurement at a healthful level.  High blood pressure causes heart and blood vessel problems.  Persistent high blood pressure should be treated with medicine if weight loss and exercise do not work.   Fat and cholesterol leaves deposits in your arteries that can block them.  This causes heart disease and vessel disease elsewhere in your body.  If your cholesterol is found to be high, or if you have heart disease or certain other medical conditions, then you may need to have your cholesterol monitored frequently and be treated with medication.   Ask if you should have a stress test if your history suggests this. A stress test is a test done on a treadmill that looks for heart disease. This test can find disease prior to there being a problem.  Avoid drinking alcohol in excess (more than two drinks per day).  Avoid use of street drugs. Do not share needles with anyone. Ask for professional help if you need assistance or instructions on stopping the use of alcohol, cigarettes, and/or drugs.  Brush your teeth twice a day with fluoride toothpaste, and floss once a day. Good oral hygiene prevents tooth decay and gum disease. The problems can be painful, unattractive, and can cause other health problems. Visit your dentist for a routine oral and dental check up and preventive care every 6-12 months.   Look at your skin regularly.  Use a mirror to look at your back. Notify your caregivers of changes in moles, especially if there are changes in shapes, colors, a size larger than a pencil eraser, an irregular border, or development of new moles.  Safety:  Use seatbelts 100% of the time, whether driving or as a passenger.  Use safety devices such as hearing protection if you work in environments with loud noise or significant background noise.  Use safety glasses when doing any work that could send debris in to the eyes.  Use a helmet if you ride a bike or motorcycle.  Use appropriate safety gear for contact sports.  Talk to your caregiver about gun safety.  Use sunscreen with a SPF (or skin protection factor) of 15 or greater.  Lighter skinned people are at a greater risk of skin cancer. Don't forget to also wear sunglasses in order to protect your eyes from too much damaging  sunlight. Damaging sunlight can accelerate cataract formation.   Practice safe sex. Use condoms. Condoms are used for birth control and to help reduce the spread of sexually transmitted infections (or STIs).  Some of the STIs are gonorrhea (the clap), chlamydia, syphilis, trichomonas, herpes, HPV (human papilloma virus) and HIV (human immunodeficiency virus) which causes AIDS. The herpes, HIV and HPV are viral illnesses that have no cure. These can result in disability, cancer and death.   Keep carbon monoxide and smoke detectors in your home functioning at all times. Change the batteries every 6 months or use a model that plugs into the wall.   Vaccinations:  Stay up to date with your tetanus shots and other required immunizations. You should have a booster for tetanus every 10 years. Be sure to get your flu shot every year, since 5%-20% of the [place] population comes down with the flu. The flu vaccine changes each year, so being vaccinated once is not enough. Get your shot in the fall, before the  flu season peaks.   Other vaccines to consider:  Pneumococcal vaccine to protect against certain types of pneumonia.  This is normally recommended for adults age 9 or older.  However, adults younger than 54 years old with certain underlying conditions such as diabetes, heart or lung disease should also receive the vaccine.  Shingles vaccine to protect against Varicella Zoster if you are older than age 68, or younger than 54 years old with certain underlying illness.  Hepatitis A vaccine to protect against a form of infection of the liver by a virus acquired from food.  Hepatitis B vaccine to protect against a form of infection of the liver by a virus acquired from blood or body fluids, particularly if you work in health care.  If you plan to travel internationally, check with your local health department for specific vaccination recommendations.  Cancer Screening:  Most routine colon cancer screening  begins at the age of 52. On a yearly basis, doctors may provide special easy to use take-home tests to check for hidden blood in the stool. Sigmoidoscopy or colonoscopy can detect the earliest forms of colon cancer and is life saving. These tests use a small camera at the end of a tube to directly examine the colon. Speak to your caregiver about this at age 37, when routine screening begins (and is repeated every 5 years unless early forms of pre-cancerous polyps or small growths are found).   At the age of 56 men usually start screening for prostate cancer every year. Screening may begin at a younger age for those with higher risk. Those at higher risk include African-Americans or having a family history of prostate cancer. There are two types of tests for prostate cancer:   Prostate-specific antigen (PSA) testing. Recent studies raise questions about prostate cancer using PSA and you should discuss this with your caregiver.   Digital rectal exam (in which your doctor's lubricated and gloved finger feels for enlargement of the prostate through the anus).   Screening for testicular cancer.  Do a monthly exam of your testicles. Gently roll each testicle between your thumb and fingers, feeling for any abnormal lumps. The best time to do this is after a hot shower or bath when the tissues are looser. Notify your caregivers of any lumps, tenderness or changes in size or shape immediately.

## 2016-01-23 LAB — PSA: PSA: 4.26 ng/mL — AB (ref <=4.00)

## 2016-01-23 LAB — TESTOSTERONE: Testosterone: 140 ng/dL — ABNORMAL LOW (ref 250–827)

## 2016-01-26 NOTE — Addendum Note (Signed)
Addended by: Minette Headland A on: 01/26/2016 03:13 PM   Modules accepted: Orders

## 2016-01-28 ENCOUNTER — Other Ambulatory Visit: Payer: Self-pay | Admitting: Internal Medicine

## 2016-01-28 DIAGNOSIS — G479 Sleep disorder, unspecified: Secondary | ICD-10-CM

## 2016-02-19 ENCOUNTER — Ambulatory Visit (INDEPENDENT_AMBULATORY_CARE_PROVIDER_SITE_OTHER): Payer: BC Managed Care – PPO | Admitting: Family Medicine

## 2016-02-19 ENCOUNTER — Encounter: Payer: Self-pay | Admitting: Family Medicine

## 2016-02-19 VITALS — BP 140/90 | HR 64 | Wt >= 6400 oz

## 2016-02-19 DIAGNOSIS — N529 Male erectile dysfunction, unspecified: Secondary | ICD-10-CM | POA: Insufficient documentation

## 2016-02-19 DIAGNOSIS — E785 Hyperlipidemia, unspecified: Secondary | ICD-10-CM | POA: Diagnosis not present

## 2016-02-19 DIAGNOSIS — R7989 Other specified abnormal findings of blood chemistry: Secondary | ICD-10-CM | POA: Insufficient documentation

## 2016-02-19 DIAGNOSIS — R972 Elevated prostate specific antigen [PSA]: Secondary | ICD-10-CM | POA: Diagnosis not present

## 2016-02-19 DIAGNOSIS — Z1211 Encounter for screening for malignant neoplasm of colon: Secondary | ICD-10-CM | POA: Diagnosis not present

## 2016-02-19 DIAGNOSIS — E291 Testicular hypofunction: Secondary | ICD-10-CM | POA: Diagnosis not present

## 2016-02-19 DIAGNOSIS — I10 Essential (primary) hypertension: Secondary | ICD-10-CM | POA: Diagnosis not present

## 2016-02-19 MED ORDER — SILDENAFIL CITRATE 100 MG PO TABS: ORAL_TABLET | ORAL | Status: DC

## 2016-02-19 MED ORDER — LISINOPRIL-HYDROCHLOROTHIAZIDE 20-12.5 MG PO TABS: 1.0000 | ORAL_TABLET | Freq: Every day | ORAL | Status: DC

## 2016-02-19 MED ORDER — SIMVASTATIN 20 MG PO TABS: 20.0000 mg | ORAL_TABLET | Freq: Every day | ORAL | Status: DC

## 2016-02-19 NOTE — Progress Notes (Signed)
Subjective:    Patient ID: Dean [name], male    DOB: [birth year], 55 y.o.   MRN: [id number]  HPI Chief Complaint  Patient presents with  . follow-up    follow-up on bp. checked it once or twice and it was 135/92   He is here for follow up on HTN. His blood pressures at home have been running 130s over 90s. Sleep apnea-he plans to call them to schedule sleep study be he has not done this yet.  Elevated PSA- does not think he ever had an elevated PSA in the past.  Low testosterone- has history of this and has had testosterone seeds as therapy in past. States he thinks his PE was related to this and does not ever plan on taking testosterone again. Referral was made to [facility]. He does not want to go to urologist at this point and just wants to keep an eye on this.  ED- would like to try a medication to see if it helps. Does not care which one, states he has never tried one in past.  Would like phentermine for weight loss. States he has taken this a couple of times in the past and has had success, lost 40 lbs at one point. He thinks he has a tough time losing weight because his metabolism is "very slow". States he does not think he is eating a lot of calories. He does not want to go to a nutritionist.  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 He reports walking 1.5 miles about 3 days per week.   Needs colonoscopy-has never had one and is willing to schedule this in the next few months.    Review of Systems Pertinent positives and negatives in the history of present illness.     Objective:   Physical Exam BP 140/90 mmHg  Pulse 64  Wt 438 lb 9.6 oz (198.948 kg)  Alert and in no distress.Cardiac exam shows a regular sinus rhythm without murmurs or gallops. Lungs are clear to auscultation.      Assessment & Plan:  Essential hypertension - Plan: lisinopril-hydrochlorothiazide (ZESTORETIC) 20-12.5 MG  tablet  Hyperlipidemia - Plan: simvastatin (ZOCOR) 20 MG tablet  Screening for colon cancer - Plan: Ambulatory referral to Gastroenterology  Erectile dysfunction, unspecified erectile dysfunction type - Plan: sildenafil (VIAGRA) 100 MG tablet  Morbid obesity, unspecified obesity type ([place])  Elevated PSA  Low testosterone  Discussed patient with Dr. Redmond School.  Discussed with patient that his blood pressure is not yet within goal and I would like for it to be consistently <140/90.  Prescription for lisinopril / HCTZ sent to pharmacy. Recommend that he continue checking his blood pressure at home and let me know if he is not within goal. Will consider amlodipine addition.  Discussed lifestyle modifications such as eating a healthy diet and increasing his physical activity for blood pressure and sever obesity. He declines checking into weight loss surgery, no interest in this. Will consider 3 month trial of phentermine at next visit. He has not been able to keep the weight off after using this in past so I will need to discuss this with him more in depth.  Prescription for simvastatin sent to pharmacy. Discussed that his increased LDL and total cholesterol place him at increased risk for developing heart disease. His 10-year ASCVD risk is 8.4% and he is in the recommended group to benefit from  a moderate intensity statin. Discussed that it is time that he start eating a low-fat, low-cholesterol diet We will do watchful waiting for his PSA since he does not desire to do anything about this, refuses to see urology at this point.  Adamant that he will not ever take testosterone again in future.  Will send a prescription for Viagra. Patient is aware that we have a 50% discount coupon and may come by to pick this up. Side effects such as a drop in blood pressure or priapism discussed.  Follow up in 1 month or sooner if needed.

## 2016-03-25 ENCOUNTER — Ambulatory Visit (INDEPENDENT_AMBULATORY_CARE_PROVIDER_SITE_OTHER): Payer: BC Managed Care – PPO | Admitting: Family Medicine

## 2016-03-25 ENCOUNTER — Encounter: Payer: Self-pay | Admitting: Family Medicine

## 2016-03-25 VITALS — BP 124/72 | HR 72 | Ht 79.25 in | Wt >= 6400 oz

## 2016-03-25 DIAGNOSIS — E785 Hyperlipidemia, unspecified: Secondary | ICD-10-CM | POA: Diagnosis not present

## 2016-03-25 DIAGNOSIS — N529 Male erectile dysfunction, unspecified: Secondary | ICD-10-CM | POA: Diagnosis not present

## 2016-03-25 DIAGNOSIS — I10 Essential (primary) hypertension: Secondary | ICD-10-CM

## 2016-03-25 MED ORDER — SILDENAFIL CITRATE 50 MG PO TABS: 50.0000 mg | ORAL_TABLET | Freq: Every day | ORAL | Status: DC | PRN

## 2016-03-25 NOTE — Progress Notes (Signed)
   Subjective:    Patient ID: Dean [name], male    DOB: 1961-10-17, 55 y.o.   MRN: [id number]  HPI Chief Complaint  Patient presents with  . follow-up    follow-up on bp. checking bp outside of here   He is here to follow up on blood pressure. States he has been checking his blood pressure at home and it has been less than 140/90 consistently, mostly 120s over 80s.   He was started on cholesterol medication and his last appointment and denies any issues with medication. Reports good compliance. He is not fasting today.  States he did not pick up Viagra prescription due to cost, $600.   States he has not yet scheduled sleep study- plans to do this.  Would like to wait until summer to have colonoscopy. He is working as a Research officer, political party and states he will have more time to do this then.   Requesting weight loss medication Phentermine. He has taken this in the past on 2 separate occasions and had success temporarily with medication, he gained the weight back. He is adamant that his metabolism is slow and is keeping him from losing weight. States he is not eating that many calories but would not give a diet recall.  Initially he refused to go to nutritionist but after some discussion he agreed.   Denies headache, dizziness, chest pain, DOE, cough or LE edema.   Review of Systems Pertinent positives and negatives in the history of present illness.     Objective:   Physical Exam BP 124/72 mmHg  Pulse 72  Ht 6' 7.25" (2.013 m)  Wt 442 lb (200.49 kg)  BMI 49.48 kg/m2  Alert and oriented and in no acute distress. Not otherwise examined.       Assessment & Plan:  Essential hypertension - Plan: Amb ref to Medical Nutrition Therapy-[facility]  Morbid obesity, unspecified obesity type ([place]) - Plan: Amb ref to Medical Nutrition Therapy-[facility]  Erectile dysfunction, unspecified erectile dysfunction type - Plan: sildenafil (VIAGRA) 50 MG tablet  Hyperlipidemia  Blood pressure appears  well controlled on current medication, no further treatment needed, continue current medication. Discussed lifestyle modification for HTN and referral made to medical nutrition therapy.  Discussed that I am not comfortable prescribing weight loss medication at this point and that it would not be appropriate based on history of rebound weight gain. Discussed that lifestyle modification is the foundation for weight loss success and maintaining weight. He agreed to go to [facility] for this.  He reports good medication compliance with simvastatin since starting it approximately 2 months ago.  He is not fasting today. Will need to order fasting lipid panel and have him return for lab visit in next 2-4 weeks to determine efficacy. Order for future lipid panel in computer.  Samples of Viagra 50 mg given with instructions for use and possible side effects.  Will follow up in 3-4 months for med check or sooner if needed.

## 2016-03-25 NOTE — Patient Instructions (Addendum)
Call your insurance and check on cologuard co-pay for screening for colorectal cancer.  The nutritionist will call you.   [facility]- [phone] (sleep study) [place]- [phone] (colonoscopy)

## 2016-03-30 ENCOUNTER — Encounter: Payer: Self-pay | Admitting: Internal Medicine

## 2016-05-10 ENCOUNTER — Encounter: Payer: Self-pay | Admitting: Dietician

## 2016-05-10 ENCOUNTER — Encounter: Payer: BC Managed Care – PPO | Attending: Family Medicine | Admitting: Dietician

## 2016-05-10 DIAGNOSIS — I10 Essential (primary) hypertension: Secondary | ICD-10-CM | POA: Diagnosis present

## 2016-05-10 NOTE — Progress Notes (Signed)
  Medical Nutrition Therapy:  Appt start time: 345 end time:  420   Assessment:  Primary concerns today: Sok is here today stating that his PA thought it would be a good idea for him to come to see a dietitian. He has lost 50 pounds on 2 separate occasions on phentermine over the course of 4 months. He has regained this weight plus ten more pounds. Feels like his metabolism is very slow. 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 He and his wife live in [place] and he lives with her on the weekends. He lives in [place] Monday through Friday in a "basement." He has a mini fridge and microwave. He does not currently have a freezer but is willing to get one. 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  Patient reports an ideal weight of 330 lbs.     Preferred Learning Style:   No preference indicated   Learning Readiness:   Contemplating  Ready   MEDICATIONS: see list   DIETARY INTAKE:  Usual eating pattern includes 2-3 meals and occasional snacks during the day (apples, protein bars, crackers).  24-hr recall:  B (6:30 AM): 5 hard boiled eggs OR protein bar (cannot remember the name)  Snk ( AM):   L ( PM): sometimes skips, may have a tuna sub from [place] Snk ( PM):  D (6:30-7 PM): cheeseburger or steak or [place] food (almost always from a restaurant) Snk ( PM): usually none  Goes to bed around 11pm  Beverages: mostly water, black coffee, sometimes diet soda, beer/liquor on the weekends   Usual physical activity: none  Estimated energy needs: 2000-2200 calories 225-248 g carbohydrates 150-165 g protein 56-61 g fat  Progress Towards Goal(s):  In progress.   Nutritional Diagnosis:  [place]-3.3 Overweight/obesity As related to history of dieting and physical inactivity.  As evidenced by BMI 51.7.     Intervention:  Nutrition counseling provided. Encouraged patient to develop healthy lifestyle changes that will be realistic for him long term. Goals: -Try keeping frozen meals on hand   -Lean Cuisine or Healthy Choice + fruit or steamer bag of vegetables -Look for a protein bar with sugars in the single digits  ([employer] or [employer] or [employer] protein bar) -Avoid skipping meals (have a bar or shake with a piece of fruit) -Have 3 eggs instead of 5 and add a carb like fruit or yogurt for breakfast -Increase physical activity (develop a routine that can work for you long term)  -Stationary bike in the evenings   -5x a week as tolerated  -Continue to be active on the weekends  Teaching Method Utilized:  Visual Auditory  Handouts given during visit include:  none  Barriers to learning/adherence to lifestyle change: back pain limiting physical activity  Demonstrated degree of understanding via:  Teach Back   Monitoring/Evaluation:  Dietary intake, exercise, and body weight prn.

## 2016-05-10 NOTE — Patient Instructions (Addendum)
-  Try keeping frozen meals on hand   -Lean Cuisine or Healthy Choice + fruit or steamer bag of vegetables  -Look for a protein bar with sugars in the single digits  ([employer] or [employer] or [employer] protein bar)  -Avoid skipping meals (have a bar or shake with a piece of fruit)  -Have 3 eggs instead of 5 and add a carb like fruit or yogurt  -Increase physical activity (develop a routine that can work for you long term)  -Stationary bike in the evenings   -5x a week as tolerated  -Continue to be active on the weekends

## 2016-05-14 ENCOUNTER — Encounter: Payer: Self-pay | Admitting: Family Medicine

## 2016-07-01 ENCOUNTER — Ambulatory Visit (INDEPENDENT_AMBULATORY_CARE_PROVIDER_SITE_OTHER): Payer: BC Managed Care – PPO | Admitting: Family Medicine

## 2016-07-01 ENCOUNTER — Encounter: Payer: Self-pay | Admitting: Internal Medicine

## 2016-07-01 ENCOUNTER — Encounter: Payer: Self-pay | Admitting: Family Medicine

## 2016-07-01 VITALS — BP 130/80 | HR 92 | Ht 78.0 in | Wt >= 6400 oz

## 2016-07-01 DIAGNOSIS — E785 Hyperlipidemia, unspecified: Secondary | ICD-10-CM

## 2016-07-01 DIAGNOSIS — I10 Essential (primary) hypertension: Secondary | ICD-10-CM | POA: Diagnosis not present

## 2016-07-01 MED ORDER — PHENTERMINE HCL 37.5 MG PO TABS: 37.5000 mg | ORAL_TABLET | Freq: Every day | ORAL | Status: DC

## 2016-07-01 MED ORDER — PHENTERMINE HCL 37.5 MG PO CAPS: 37.5000 mg | ORAL_CAPSULE | ORAL | Status: DC

## 2016-07-01 NOTE — Progress Notes (Signed)
Subjective:    Patient ID: Dean [name], male    DOB: 12/01/1961, 55 y.o.   MRN: [id number]  HPI Chief Complaint  Patient presents with  . med check    med check-   He is here for a med check. States he is taking daily pro-pressure medication and cholesterol medication without any side effects. He has not been checking his blood pressure at home.  He has not returned to have lipids checked after starting on a statin. States he went to the nutritionist. Request phentermine again today. He has been requesting this medication in previous visits. States he feels like his metabolism is very slow with this medication will increase his metabolism and give him more energy.    states he last took phentermine 2 years ago and did quite well on the medication. States he lost 40 pounds at that time. However, he did not continue with healthy lifestyle modification and he gained the weight plus additional weight.   Sleep study was ordered at previous visit.  Epworth Sleep scale was elevated. He did not go for the sleep study. States he is interested in doing this now.  He was referred for colonoscopy however he declined to do this. States he is not interested at this time.   Denies fever, chills, dizziness, headache, chest pain, DOE, abdominal pain, nausea, vomiting, diarrhea, LE edema or GU symptoms.   Allergies, medications, past medical and social history.   Review of Systems Pertinent positives and negatives in the history of present illness.     Objective:   Physical Exam BP 130/80 mmHg  Pulse 92  Ht 6\' 6"  (1.981 m)  Wt 450 lb 3.2 oz (204.209 kg)  BMI 52.04 kg/m2 Alert and in no distress. Pharyngeal area is normal. Neck is supple without adenopathy or thyromegaly. Cardiac exam shows a regular sinus rhythm without murmurs or gallops. Lungs are clear to auscultation. Lower extremities without edema. Pulses intact. Mood, thought content and judgment appropriate.      Assessment & Plan:    Essential hypertension  Hyperlipidemia  Morbid obesity, unspecified obesity type ([place])  Discussed that his blood pressure appears well managed on his current medications. No changes recommended at this time.  Morbid Obesity- patient has repeatedly requested weight loss medication specifically phentermine. This was not prescribed to him due to uncontrolled hypertension. He went for a visit with the nutritionist per my request. States he did not learn anything that he did not know.  He is adamant that he would like to try medication again. Phentermine prescription given to patient and in depth discussion regarding possible side effects including increased HR, palpitations, increased BP, even MI and death and he verbalized understanding. Discussed that in the past he has lost weight on this medication however he has regained the weight plus a few pounds each time. Questioned patient on what he plans to do differently this time and he states he plans to increase physical activity and watch his calories. Discussed lifestyle modifications for healthy weight loss. Offered patient referral to bariatric clinic to discuss weight loss surgery and he declines this.  He will need to return for weight check and to see me to discuss how he is doing on phentermine in order for me to refill this. Hyperlipidemia: Plan to repeat lipid panel. He will return tomorrow fasting. Order is in the system. Will follow-up pending labs and adjust medication as appropriate. Plans to follow up for sleep study. He has been putting this off  and states he is willing to do this. Order is in system.  Discussed colonoscopy or cologuard. He still refuses to do this. Discussed risks of colon cancer and he is aware.  Spent a minimum of 25 minutes face-to-face with patient and at at least 50% was in counseling and coordination of care. Counseled on healthy lifestyle to manage hypertension, hyperlipidemia and weight loss. Also counseled on  preventive care such as colonoscopy.

## 2016-07-01 NOTE — Patient Instructions (Signed)
Phentermine tablets or capsules What is this medicine? PHENTERMINE (FEN ter meen) decreases your appetite. It is used with a reduced calorie diet and exercise to help you lose weight. This medicine may be used for other purposes; ask your health care provider or pharmacist if you have questions. What should I tell my health care provider before I take this medicine? They need to know if you have any of these conditions: -agitation -glaucoma -heart disease -high blood pressure -history of substance abuse -lung disease called Primary Pulmonary Hypertension (PPH) -taken an MAOI like Carbex, Eldepryl, Marplan, Nardil, or Parnate in last 14 days -thyroid disease -an unusual or allergic reaction to phentermine, other medicines, foods, dyes, or preservatives -pregnant or trying to get pregnant -breast-feeding How should I use this medicine? Take this medicine by mouth with a glass of water. Follow the directions on the prescription label. This medicine is usually taken 30 minutes before or 1 to 2 hours after breakfast. Avoid taking this medicine in the evening. It may interfere with sleep. Take your doses at regular intervals. Do not take your medicine more often than directed. Talk to your pediatrician regarding the use of this medicine in children. Special care may be needed. Overdosage: If you think you have taken too much of this medicine contact a poison control center or emergency room at once. NOTE: This medicine is only for you. Do not share this medicine with others. What if I miss a dose? If you miss a dose, take it as soon as you can. If it is almost time for your next dose, take only that dose. Do not take double or extra doses. What may interact with this medicine? Do not take this medicine with any of the following medications: -duloxetine -MAOIs like Carbex, Eldepryl, Marplan, Nardil, and Parnate -medicines for colds or breathing difficulties like pseudoephedrine or  phenylephrine -procarbazine -sibutramine -SSRIs like citalopram, escitalopram, fluoxetine, fluvoxamine, paroxetine, and sertraline -stimulants like dexmethylphenidate, methylphenidate or modafinil -venlafaxine This medicine may also interact with the following medications: -medicines for diabetes This list may not describe all possible interactions. Give your health care provider a list of all the medicines, herbs, non-prescription drugs, or dietary supplements you use. Also tell them if you smoke, drink alcohol, or use illegal drugs. Some items may interact with your medicine. What should I watch for while using this medicine? Notify your physician immediately if you become short of breath while doing your normal activities. Do not take this medicine within 6 hours of bedtime. It can keep you from getting to sleep. Avoid drinks that contain caffeine and try to stick to a regular bedtime every night. This medicine was intended to be used in addition to a healthy diet and exercise. The best results are achieved this way. This medicine is only indicated for short-term use. Eventually your weight loss may level out. At that point, the drug will only help you maintain your new weight. Do not increase or in any way change your dose without consulting your doctor. You may get drowsy or dizzy. Do not drive, use machinery, or do anything that needs mental alertness until you know how this medicine affects you. Do not stand or sit up quickly, especially if you are an older patient. This reduces the risk of dizzy or fainting spells. Alcohol may increase dizziness and drowsiness. Avoid alcoholic drinks. What side effects may I notice from receiving this medicine? Side effects that you should report to your doctor or health care professional as soon  as possible: -chest pain, palpitations -depression or severe changes in mood -increased blood pressure -irritability -nervousness or restlessness -severe  dizziness -shortness of breath -problems urinating -unusual swelling of the legs -vomiting Side effects that usually do not require medical attention (report to your doctor or health care professional if they continue or are bothersome): -blurred vision or other eye problems -changes in sexual ability or desire -constipation or diarrhea -difficulty sleeping -dry mouth or unpleasant taste -headache -nausea This list may not describe all possible side effects. Call your doctor for medical advice about side effects. You may report side effects to FDA at 1-800-FDA-1088. Where should I keep my medicine? Keep out of the reach of children. This medicine can be abused. Keep your medicine in a safe place to protect it from theft. Do not share this medicine with anyone. Selling or giving away this medicine is dangerous and against the law. This medicine may cause accidental overdose and death if taken by other adults, children, or pets. Mix any unused medicine with a substance like cat litter or coffee grounds. Then throw the medicine away in a sealed container like a sealed bag or a coffee can with a lid. Do not use the medicine after the expiration date. Store at room temperature between 20 and 25 degrees C (68 and 77 degrees F). Keep container tightly closed. NOTE: This sheet is a summary. It may not cover all possible information. If you have questions about this medicine, talk to your doctor, pharmacist, or health care provider.    2016, Elsevier/Gold Standard. (2014-08-27 16:19:53)

## 2016-07-02 ENCOUNTER — Other Ambulatory Visit: Payer: BC Managed Care – PPO

## 2016-07-02 DIAGNOSIS — E785 Hyperlipidemia, unspecified: Secondary | ICD-10-CM

## 2016-07-02 DIAGNOSIS — I10 Essential (primary) hypertension: Secondary | ICD-10-CM

## 2016-07-02 LAB — BASIC METABOLIC PANEL
BUN: 22 mg/dL (ref 7–25)
CHLORIDE: 102 mmol/L (ref 98–110)
CO2: 29 mmol/L (ref 20–31)
Calcium: 8.9 mg/dL (ref 8.6–10.3)
Creat: 1.16 mg/dL (ref 0.70–1.33)
GLUCOSE: 126 mg/dL — AB (ref 65–99)
POTASSIUM: 4.6 mmol/L (ref 3.5–5.3)
SODIUM: 143 mmol/L (ref 135–146)

## 2016-07-02 LAB — LIPID PANEL
CHOLESTEROL: 181 mg/dL (ref 125–200)
HDL: 47 mg/dL (ref >=40)
LDL Cholesterol: 108 mg/dL (ref <130)
TRIGLYCERIDES: 131 mg/dL (ref <150)
Total CHOL/HDL Ratio: 3.9 Ratio (ref <=5.0)
VLDL: 26 mg/dL (ref <30)

## 2016-07-06 ENCOUNTER — Ambulatory Visit (INDEPENDENT_AMBULATORY_CARE_PROVIDER_SITE_OTHER): Payer: BC Managed Care – PPO | Admitting: Family Medicine

## 2016-07-06 ENCOUNTER — Encounter: Payer: Self-pay | Admitting: Family Medicine

## 2016-07-06 VITALS — BP 132/82 | Wt >= 6400 oz

## 2016-07-06 DIAGNOSIS — E119 Type 2 diabetes mellitus without complications: Secondary | ICD-10-CM

## 2016-07-06 DIAGNOSIS — R7303 Prediabetes: Secondary | ICD-10-CM | POA: Diagnosis not present

## 2016-07-06 HISTORY — DX: Type 2 diabetes mellitus without complications: E11.9

## 2016-07-06 LAB — POCT GLYCOSYLATED HEMOGLOBIN (HGB A1C): Hemoglobin A1C: 5.8

## 2016-07-06 NOTE — Patient Instructions (Addendum)
Your hemoglobin A1c is 5.8% which places you in the prediabetes range (5.7-6.4). We will repeat this in 6 months.  Schedule an appointment before you need a refill on Phentermine so we can see if it is working.   Prediabetes Eating Plan Prediabetes--also called impaired glucose tolerance or impaired fasting glucose--is a condition that causes blood sugar (blood glucose) levels to be higher than normal. Following a healthy diet can help to keep prediabetes under control. It can also help to lower the risk of type 2 diabetes and heart disease, which are increased in people who have prediabetes. Along with regular exercise, a healthy diet:  Promotes weight loss.  Helps to control blood sugar levels.  Helps to improve the way that the body uses insulin. WHAT DO I NEED TO KNOW ABOUT THIS EATING PLAN?  Use the glycemic index (GI) to plan your meals. The index tells you how quickly a food will raise your blood sugar. Choose low-GI foods. These foods take a longer time to raise blood sugar.  Pay close attention to the amount of carbohydrates in the food that you eat. Carbohydrates increase blood sugar levels.  Keep track of how many calories you take in. Eating the right amount of calories will help you to achieve a healthy weight. Losing about 7 percent of your starting weight can help to prevent type 2 diabetes.  You may want to follow a Mediterranean diet. This diet includes a lot of vegetables, lean meats or fish, whole grains, fruits, and healthy oils and fats. WHAT FOODS CAN I EAT? Grains Whole grains, such as whole-wheat or whole-grain breads, crackers, cereals, and pasta. Unsweetened oatmeal. Bulgur. Barley. Quinoa. Brown rice. Corn or whole-wheat flour tortillas or taco shells. Vegetables Lettuce. Spinach. Peas. Beets. Cauliflower. Cabbage. Broccoli. Carrots. Tomatoes. Squash. Eggplant. Herbs. Peppers. Onions. Cucumbers. Brussels sprouts. Fruits Berries. Bananas. Apples. Oranges. Grapes.  Papaya. Mango. Pomegranate. Kiwi. Grapefruit. Cherries. Meats and Other Protein Sources Seafood. Lean meats, such as chicken and [place] or lean cuts of pork and beef. Tofu. Eggs. Nuts. Beans. Dairy Low-fat or fat-free dairy products, such as yogurt, cottage cheese, and cheese. Beverages Water. Tea. Coffee. Sugar-free or diet soda. Seltzer water. Milk. Milk alternatives, such as soy or almond milk. Condiments Mustard. Relish. Low-fat, low-sugar ketchup. Low-fat, low-sugar barbecue sauce. Low-fat or fat-free mayonnaise. Sweets and Desserts Sugar-free or low-fat pudding. Sugar-free or low-fat ice cream and other frozen treats. Fats and Oils Avocado. Walnuts. Olive oil. The items listed above may not be a complete list of recommended foods or beverages. Contact your dietitian for more options.  WHAT FOODS ARE NOT RECOMMENDED? Grains Refined white flour and flour products, such as bread, pasta, snack foods, and cereals. Beverages Sweetened drinks, such as sweet iced tea and soda. Sweets and Desserts Baked goods, such as cake, cupcakes, pastries, cookies, and cheesecake. The items listed above may not be a complete list of foods and beverages to avoid. Contact your dietitian for more information.   This information is not intended to replace advice given to you by your health care provider. Make sure you discuss any questions you have with your health care provider.   Document Released: 04/22/2015 Document Reviewed: 04/22/2015 Elsevier Interactive Patient Education [employer].

## 2016-07-06 NOTE — Progress Notes (Signed)
   Subjective:    Patient ID: Trez Conliffe, male    DOB: 04-07-1961, 55 y.o.   MRN: [id number]  HPI Chief Complaint  Patient presents with  . A1c    a1c   He is here for a Hemoblobin A1c due to impaired fasting glucose.  Denies fever, chills, abdominal pain, nausea, vomiting, diarrhea, urinary frequency. Denies excessive thirst.    Review of Systems Pertinent positives and negatives in the history of present illness.     Objective:   Physical Exam BP 132/82 mmHg  Wt 443 lb 9.6 oz (201.216 kg)  Alert and oriented and in no acute distress. Not otherwise examined.   A1c 5.8     Assessment & Plan:  Prediabetes - Plan: POCT glycosylated hemoglobin (Hb A1C)  Discussed what a hemoglobin A1c indicates and that his A1c today places him in the prediabetes range at 5.8%. Discussed that he is at an increased risk for developing diabetes and heart disease and recommend lifestyle modifications to prevent this progression. He recently started on Phentermine and states he has lost 7 lbs. He has started exercising. Will repeat A1c in 6 months.

## 2016-07-07 ENCOUNTER — Ambulatory Visit: Payer: BC Managed Care – PPO | Admitting: Family Medicine

## 2016-08-02 ENCOUNTER — Institutional Professional Consult (permissible substitution): Payer: BC Managed Care – PPO | Admitting: Family Medicine

## 2016-08-06 ENCOUNTER — Encounter: Payer: Self-pay | Admitting: Family Medicine

## 2016-09-11 ENCOUNTER — Other Ambulatory Visit: Payer: Self-pay | Admitting: Family Medicine

## 2016-09-11 DIAGNOSIS — E785 Hyperlipidemia, unspecified: Secondary | ICD-10-CM

## 2016-12-19 ENCOUNTER — Other Ambulatory Visit: Payer: Self-pay | Admitting: Family Medicine

## 2016-12-19 DIAGNOSIS — E785 Hyperlipidemia, unspecified: Secondary | ICD-10-CM

## 2017-02-19 ENCOUNTER — Other Ambulatory Visit: Payer: Self-pay | Admitting: Family Medicine

## 2017-02-19 DIAGNOSIS — I10 Essential (primary) hypertension: Secondary | ICD-10-CM

## 2017-02-24 ENCOUNTER — Ambulatory Visit
Admission: RE | Admit: 2017-02-24 | Discharge: 2017-02-24 | Disposition: A | Discharge status: Home / Self Care | Payer: BC Managed Care – PPO | Source: Ambulatory Visit | Attending: Family Medicine | Admitting: Family Medicine

## 2017-02-24 ENCOUNTER — Ambulatory Visit (INDEPENDENT_AMBULATORY_CARE_PROVIDER_SITE_OTHER): Payer: BC Managed Care – PPO | Admitting: Family Medicine

## 2017-02-24 ENCOUNTER — Encounter: Payer: Self-pay | Admitting: Family Medicine

## 2017-02-24 VITALS — BP 130/90 | HR 98 | Temp 98.1°F | Resp 18 | Wt >= 6400 oz

## 2017-02-24 DIAGNOSIS — R0789 Other chest pain: Secondary | ICD-10-CM

## 2017-02-24 DIAGNOSIS — R7303 Prediabetes: Secondary | ICD-10-CM

## 2017-02-24 DIAGNOSIS — I10 Essential (primary) hypertension: Secondary | ICD-10-CM | POA: Diagnosis not present

## 2017-02-24 LAB — CBC WITH DIFFERENTIAL/PLATELET
BASOS ABS: 0 {cells}/uL (ref 0–200)
Basophils Relative: 0 %
EOS ABS: 142 {cells}/uL (ref 15–500)
Eosinophils Relative: 2 %
HCT: 44.6 % (ref 38.5–50.0)
HEMOGLOBIN: 15 g/dL (ref 13.2–17.1)
LYMPHS ABS: 1562 {cells}/uL (ref 850–3900)
Lymphocytes Relative: 22 %
MCH: 30.2 pg (ref 27.0–33.0)
MCHC: 33.6 g/dL (ref 32.0–36.0)
MCV: 89.7 fL (ref 80.0–100.0)
MONO ABS: 710 {cells}/uL (ref 200–950)
MPV: 11.3 fL (ref 7.5–12.5)
Monocytes Relative: 10 %
NEUTROS ABS: 4686 {cells}/uL (ref 1500–7800)
NEUTROS PCT: 66 %
PLATELETS: 247 10*3/uL (ref 140–400)
RBC: 4.97 MIL/uL (ref 4.20–5.80)
RDW: 13.8 % (ref 11.0–15.0)
WBC: 7.1 10*3/uL (ref 4.0–10.5)

## 2017-02-24 LAB — COMPREHENSIVE METABOLIC PANEL
ALT: 34 U/L (ref 9–46)
AST: 19 U/L (ref 10–35)
Albumin: 4.3 g/dL (ref 3.6–5.1)
Alkaline Phosphatase: 72 U/L (ref 40–115)
BILIRUBIN TOTAL: 0.5 mg/dL (ref 0.2–1.2)
BUN: 16 mg/dL (ref 7–25)
CO2: 28 mmol/L (ref 20–31)
Calcium: 9.6 mg/dL (ref 8.6–10.3)
Chloride: 102 mmol/L (ref 98–110)
Creat: 1.34 mg/dL — ABNORMAL HIGH (ref 0.70–1.33)
GLUCOSE: 123 mg/dL — AB (ref 65–99)
POTASSIUM: 4.2 mmol/L (ref 3.5–5.3)
SODIUM: 141 mmol/L (ref 135–146)
Total Protein: 6.5 g/dL (ref 6.1–8.1)

## 2017-02-24 LAB — TSH: TSH: 2.64 m[IU]/L (ref 0.40–4.50)

## 2017-02-24 LAB — HEMOGLOBIN A1C
Hgb A1c MFr Bld: 5.6 % (ref <5.7)
Mean Plasma Glucose: 114 mg/dL

## 2017-02-24 NOTE — Progress Notes (Signed)
Subjective:    Patient ID: Dean [name], male    DOB: 09-17-1961, 56 y.o.   MRN: [id number]  HPI Chief Complaint  Patient presents with  . pain    pain right under collarbone. had this for like 2 months   He is a 56 year old caucasian male with a history of pulmonary embolism, HTN, prediabetes, and morbid obesity who is here with complaints of a 3 month history of intermittent left upper chest pain that is present with deep inspiration. Pain is described as a dull ache and non radiating. States he has 3-4 episodes per day most days of the week. Pain is resolved with changing positions. Pain is not getting worse and he has not had pain today.  Also reports a 3 month history of dry cough and questionable DOE. Denies leg pain or LE edema.   Denies fever, chills, night sweats, unexplained weight loss, fatigue, dizziness, palpitations, shortness of breath, orthopnea, abdominal pain, nausea, vomiting, diarrhea.   No history of smoking.   States his brother was diagnosed with prostate cancer today and he is worried about this. States he is feeling a little anxious.   He has not followed up for HTN or cholesterol. 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   Reviewed allergies, medications, past medical, surgical,and social history.   Review of Systems Pertinent positives and negatives in the history of present illness.     Objective:   Physical Exam  Constitutional: He is oriented to person, place, and time. He appears well-developed and well-nourished. No distress.  Eyes: Conjunctivae are normal.  Neck: Normal range of motion. Neck supple. No JVD present. No thyromegaly present.  Cardiovascular: Normal rate, regular rhythm, normal heart sounds and intact distal pulses.  Exam reveals no gallop and no friction rub.   No murmur heard. No LE edema  Pulmonary/Chest: Effort normal and breath sounds normal. He exhibits no tenderness.    Musculoskeletal:       Left shoulder: He exhibits crepitus. He exhibits normal range of motion, no tenderness and normal strength.       Right lower leg: Normal.       Left lower leg: Normal.  No calf tenderness  Lymphadenopathy:    He has no cervical adenopathy.       Right: No supraclavicular adenopathy present.       Left: No supraclavicular adenopathy present.  Neurological: He is alert and oriented to person, place, and time.  Skin: Skin is warm and dry. No pallor.  Psychiatric: He has a normal mood and affect. His behavior is normal.   BP 130/90   Pulse 98   Temp 98.1 F (36.7 C) (Oral)   Resp 18   Wt (!) 439 lb 12.8 oz (199.5 kg)   SpO2 98%   BMI 50.82 kg/m       Assessment & Plan:  Intermittent left-sided chest pain - Plan: CBC with Differential/Platelet, Comprehensive metabolic panel, EKG 76-[facility], TSH, DG Chest 2 View  Essential hypertension - Plan: CBC with Differential/Platelet, Comprehensive metabolic panel, EKG 47-[facility], DG Chest 2 View  Prediabetes - Plan: Hemoglobin A1c, TSH  Discussed that the intermittent chest pain that is relieved with movement speaks to this being musculoskeletal. Will have him try 2 Aleve twice daily with food for the next 1-2 weeks and see if this helps. Very unlikely that this is related to a cardiac etiology or PE.  Discussed that his exam is unremarkable and unable to reproduce the pain with exam.  ECG shows sinus tachycardia at 108.   Will send him for a chest XR and get labs due to persistent dry cough. This may be related to GERD or allergies and will re-evaluate this after getting chest XR.  Advised that if chest pain worsens or if he develops any new symptoms such as shortness of breath to seek immediate medical care.  Discussed that his BP is not within goal. He is anxious appearing and upset regarding news of his brother's prostates cancer diagnosis earlier today. Will recheck this in 2 weeks when he returns.  Will check for  diabetes.  He will need to return for fasting lipids and physical exam. He has not gotten a colonoscopy yet but plans to do this in June or July.

## 2017-03-15 ENCOUNTER — Encounter: Payer: Self-pay | Admitting: Family Medicine

## 2017-03-15 ENCOUNTER — Ambulatory Visit (INDEPENDENT_AMBULATORY_CARE_PROVIDER_SITE_OTHER): Payer: BC Managed Care – PPO | Admitting: Family Medicine

## 2017-03-15 ENCOUNTER — Ambulatory Visit
Admission: RE | Admit: 2017-03-15 | Discharge: 2017-03-15 | Disposition: A | Discharge status: Home / Self Care | Payer: BC Managed Care – PPO | Source: Ambulatory Visit | Attending: Family Medicine | Admitting: Family Medicine

## 2017-03-15 VITALS — BP 130/90 | HR 89 | Ht 78.25 in | Wt >= 6400 oz

## 2017-03-15 DIAGNOSIS — M5441 Lumbago with sciatica, right side: Principal | ICD-10-CM

## 2017-03-15 DIAGNOSIS — R0681 Apnea, not elsewhere classified: Secondary | ICD-10-CM | POA: Diagnosis not present

## 2017-03-15 DIAGNOSIS — I10 Essential (primary) hypertension: Secondary | ICD-10-CM

## 2017-03-15 DIAGNOSIS — R0683 Snoring: Secondary | ICD-10-CM

## 2017-03-15 DIAGNOSIS — R972 Elevated prostate specific antigen [PSA]: Secondary | ICD-10-CM

## 2017-03-15 DIAGNOSIS — G8929 Other chronic pain: Secondary | ICD-10-CM

## 2017-03-15 DIAGNOSIS — Z23 Encounter for immunization: Secondary | ICD-10-CM

## 2017-03-15 DIAGNOSIS — Z0000 Encounter for general adult medical examination without abnormal findings: Secondary | ICD-10-CM | POA: Diagnosis not present

## 2017-03-15 DIAGNOSIS — Z8042 Family history of malignant neoplasm of prostate: Secondary | ICD-10-CM

## 2017-03-15 DIAGNOSIS — Z9189 Other specified personal risk factors, not elsewhere classified: Secondary | ICD-10-CM

## 2017-03-15 DIAGNOSIS — M545 Low back pain: Secondary | ICD-10-CM

## 2017-03-15 LAB — BASIC METABOLIC PANEL
BUN: 20 mg/dL (ref 7–25)
CALCIUM: 9.1 mg/dL (ref 8.6–10.3)
CO2: 27 mmol/L (ref 20–31)
Chloride: 102 mmol/L (ref 98–110)
Creat: 1.27 mg/dL (ref 0.70–1.33)
GLUCOSE: 108 mg/dL — AB (ref 65–99)
Potassium: 5.1 mmol/L (ref 3.5–5.3)
Sodium: 139 mmol/L (ref 135–146)

## 2017-03-15 LAB — POCT URINALYSIS DIPSTICK
BILIRUBIN UA: NEGATIVE
Blood, UA: NEGATIVE
Glucose, UA: NEGATIVE
KETONES UA: NEGATIVE
Leukocytes, UA: NEGATIVE
NITRITE UA: NEGATIVE
PH UA: 6 (ref 5.0–8.0)
Protein, UA: NEGATIVE
Spec Grav, UA: 1.015 (ref 1.030–1.035)
Urobilinogen, UA: NEGATIVE (ref ?–2.0)

## 2017-03-15 LAB — PSA: PSA: 2.9 ng/mL (ref <=4.0)

## 2017-03-15 LAB — LIPID PANEL
CHOL/HDL RATIO: 3.9 ratio (ref <5.0)
Cholesterol: 188 mg/dL (ref <200)
HDL: 48 mg/dL (ref >40)
LDL CALC: 117 mg/dL — AB (ref <100)
TRIGLYCERIDES: 116 mg/dL (ref <150)
VLDL: 23 mg/dL (ref <30)

## 2017-03-15 NOTE — Progress Notes (Signed)
Subjective:    Patient ID: Dean [name], male    DOB: 07-Nov-1961, 56 y.o.   MRN: [id number]  HPI Chief Complaint  Patient presents with  . cpe fasting    fasting cpe   He is here for a complete physical exam. States his brother was recently diagnosed with prostate cancer at age 51. The patient has had an elevated PSA in the past with a history of hypogonadism and testosterone pellets prior to moving to [place]. He has not wanted to see a urologist in our previous discussions but today he is requesting a referral for further evaluation.   States his wife has been telling him that he stops breathing in his sleep, snores "like a bear" and he states he "knows" he has sleep apnea but did not have testing done.   HTN- checks his BP at home and has been seeing BP in the 130s/upper 80s and low 90s. No issues with medication. Reports good compliance.   Morbid obesity- he is not watching his diet or exercising. States he is not exercising due to low back pain.  Complains of low back pain that is chronic but seems to be getting some worse. States he is having some right sided numbness and tingling and sharp pain shooting down the RLE posteriorly to his mid upper leg. 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 Does not have an orthopedist in this area.  Denies saddle anesthesia, no loss of control of bowels or bladder. No falls.   Other providers: dentist   Prediabetes in past. Last A1c 5.6% February 24 2017  Social history: Lives with wife, works as a Research officer, political party at [employer].   Diet: nothing particular Exercise: not very active  Immunizations: Tdap more than 10 years per patient.   Health maintenance:  Colonoscopy: never. The referral has been in since last year's visit. He has not scheduled with them. States he is willing to do this.  Last PSA: elevated Last Dental Exam: twice annually  Last Eye Exam: December 2017  Wears  seatbelt always, uses sunscreen, smoke detectors in home and functioning, does not text while driving, feels safe in home environment.  Reviewed allergies, medications, past medical, surgical, family, and social history.  Past Medical History:  Diagnosis Date  . Elevated PSA   . History of prediabetes    last A1c 5.6% in March 2018  . Hypertension   . Hypogonadism in male   . Morbid obesity ([place])   . PE (pulmonary embolism) 2014   question testosterone pellets as cause   Past Surgical History:  Procedure Laterality Date  . JOINT REPLACEMENT     Current Outpatient Prescriptions on File Prior to Visit  Medication Sig Dispense Refill  . lisinopril-hydrochlorothiazide (PRINZIDE,ZESTORETIC) 20-12.5 MG tablet TAKE 1 TABLET BY MOUTH DAILY. 30 tablet 0  . Multiple Vitamin (MULTIVITAMIN) tablet Take 1 tablet by mouth daily.    . simvastatin (ZOCOR) 20 MG tablet TAKE 1 TABLET AT BEDTIME 90 tablet 0   No current facility-administered medications on file prior to visit.      Review of Systems Review of Systems Constitutional: -fever, -chills, -sweats, -unexpected weight change,-fatigue ENT: -runny nose, -ear pain, -sore throat Cardiology:  -chest pain, -palpitations, -edema Respiratory: -cough, -shortness of breath, -wheezing Gastroenterology: -abdominal pain, -nausea, -vomiting, -diarrhea, -constipation  Hematology: -bleeding or bruising problems Musculoskeletal: -arthralgias, -myalgias, -joint swelling, + low back pain Ophthalmology: -vision changes Urology: -  dysuria, -difficulty urinating, -hematuria, +urinary frequency, -urgency Neurology: -headache, -weakness, -tingling, -numbness       Objective:   Physical Exam BP 130/90   Pulse 89   Ht 6' 6.25" (1.988 m)   Wt (!) 445 lb 9.6 oz (202.1 kg)   BMI 51.17 kg/m   General Appearance:    Alert, cooperative, no distress, appears stated age  Head:    Normocephalic, without obvious abnormality, atraumatic  Eyes:    PERRL,  conjunctiva/corneas clear, EOM's intact, fundi    benign  Ears:    Normal TM's and external ear canals  Nose:   Nares normal, mucosa normal, no drainage or sinus   tenderness  Throat:   Lips, mucosa, and tongue normal; teeth and gums normal  Neck:   Supple, no lymphadenopathy;  thyroid:  no   enlargement/tenderness/nodules; no carotid   bruit or JVD  Back:    Spine nontender, no curvature, ROM normal, no CVA     Tenderness. Tenderness to right sciatic notch. RLE is neurovascularly intact. Negative straight leg raise.   Lungs:     Clear to auscultation bilaterally without wheezes, rales or     ronchi; respirations unlabored  Chest Wall:    No tenderness or deformity   Heart:    Regular rate and rhythm, S1 and S2 normal, no murmur, rub   or gallop  Breast Exam:    No chest wall tenderness, masses or gynecomastia  Abdomen:     Soft, obese, non-tender, nondistended, normoactive bowel sounds,    no masses, no hepatosplenomegaly  Genitalia:    Declines. Referred to urologist.   Rectal:    Declines and referred to urologist.   Extremities:   No clubbing, cyanosis or edema  Pulses:   2+ and symmetric all extremities  Skin:   Skin color, texture, turgor normal, no rashes or lesions  Lymph nodes:   Cervical, supraclavicular, and axillary nodes normal  Neurologic:   CNII-XII intact, normal strength, sensation and gait; reflexes 2+ and symmetric throughout          Psych:   Normal mood, affect, hygiene and grooming.    Urinalysis dipstick:  Negative      Assessment & Plan:  Routine general medical examination at a health care facility - Plan: Lipid panel, Basic metabolic panel, POCT urinalysis dipstick, Split night study  Essential hypertension  Elevated PSA - Plan: PSA, Ambulatory referral to Urology  Morbid obesity ([place]) - Plan: Lipid panel, Basic metabolic panel  Need for Tdap vaccination - Plan: Tdap vaccine greater than or equal to 7yo IM  Chronic right-sided low back pain with  right-sided sciatica - Plan: DG Lumbar Spine Complete  Family history of prostate cancer - Plan: PSA, Ambulatory referral to Urology  Witnessed episode of apnea  Snoring  At risk for obstructive sleep apnea  Epworth sleepiness scale: 15  Reports that his wife has observed him having periods of apnea while sleeping for years and he has not had a sleep study. Reports snoring, daytime sleepiness, occasional morning headaches.   HTN- discussed that his blood pressure is not within goal today. No change to medication, discussed DASH diet and physical activity. Suspect sleep apnea if present is contributing to his HTN not being controlled. Will re-evaluate BP after sleep study.  Low back pain with right sided sciatica- plan to send him for a lumbar XR. He takes anti-inflammatories as needed for pain. He will continue doing this. Offered referral to orthopedist. He does not have  one in this area. He will let me know if he would like to establish with ortho pending XR.  Plan to refer him to urology for elevate PSA, family history of prostate cancer in brother and father. History of hypogonadism and urinary urgency. History of testosterone therapy which was suspected to be the cause for his PE.  Counseled on weight loss by starting to track his daily caloric intake with a free app called My Fitness Pal. He is aware that his morbid obesity is putting him at an increased risk of worsening health.  Tdap given.  Will follow up pending XR and labs.

## 2017-03-15 NOTE — Telephone Encounter (Signed)
Please refer patient to ortho.

## 2017-03-15 NOTE — Addendum Note (Signed)
Addended by: Minette Headland A on: 03/15/2017 02:35 PM   Modules accepted: Orders

## 2017-03-15 NOTE — Patient Instructions (Addendum)
You will be contacted to schedule an appointment with the urologist.   Call and schedule your initial GI appointment and then the colonoscopy.  [facility] [phone] [place] will be contacted about the sleep study.   Download the free app My [employer]itness Pal and follow the steps to set it up.   Preventative Care for Adults, Male       REGULAR HEALTH EXAMS:  A routine yearly physical is a good way to check in with your primary care provider about your health and preventive screening. It is also an opportunity to share updates about your health and any concerns you have, and receive a thorough all-over exam.   Most health insurance companies pay for at least some preventative services.  Check with your health plan for specific coverages.  WHAT PREVENTATIVE SERVICES DO MEN NEED?  Adult men should have their weight and blood pressure checked regularly.   Men age 60 and older should have their cholesterol levels checked regularly.  Beginning at age 1 and continuing to age 38, men should be screened for colorectal cancer.  Certain people should may need continued testing until age 36.  Other cancer screening may include exams for testicular and prostate cancer.  Updating vaccinations is part of preventative care.  Vaccinations help protect against diseases such as the flu.  Lab tests are generally done as part of preventative care to screen for anemia and blood disorders, to screen for problems with the kidneys and liver, to screen for bladder problems, to check blood sugar, and to check your cholesterol level.  Preventative services generally include counseling about diet, exercise, avoiding tobacco, drugs, excessive alcohol consumption, and sexually transmitted infections.    GENERAL RECOMMENDATIONS FOR GOOD HEALTH:  Healthy diet:  Eat a variety of foods, including fruit, vegetables, animal or vegetable protein, such as meat, fish, chicken, and  eggs, or beans, lentils, tofu, and grains, such as rice.  Drink plenty of water daily.  Decrease saturated fat in the diet, avoid lots of red meat, processed foods, sweets, fast foods, and fried foods.  Exercise:  Aerobic exercise helps maintain good heart health. At least 30-40 minutes of moderate-intensity exercise is recommended. For example, a brisk walk that increases your heart rate and breathing. This should be done on most days of the week.   Find a type of exercise or a variety of exercises that you enjoy so that it becomes a part of your daily life.  Examples are running, walking, swimming, water aerobics, and biking.  For motivation and support, explore group exercise such as aerobic class, spin class, Zumba, Yoga,or  martial arts, etc.    Set exercise goals for yourself, such as a certain weight goal, walk or run in a race such as a 5k walk/run.  Speak to your primary care provider about exercise goals.  Disease prevention:  If you smoke or chew tobacco, find out from your caregiver how to quit. It can literally save your life, no matter how long you have been a tobacco user. If you do not use tobacco, never begin.   Maintain a healthy diet and normal weight. Increased weight leads to problems with blood pressure and diabetes.   The Body Mass Index or BMI is a way of measuring how much of your body is fat. Having a BMI above 27 increases the risk of heart disease, diabetes, hypertension, stroke and other problems related to obesity. Your caregiver can help determine your  BMI and based on it develop an exercise and dietary program to help you achieve or maintain this important measurement at a healthful level.  High blood pressure causes heart and blood vessel problems.  Persistent high blood pressure should be treated with medicine if weight loss and exercise do not work.   Fat and cholesterol leaves deposits in your arteries that can block them. This causes heart disease and  vessel disease elsewhere in your body.  If your cholesterol is found to be high, or if you have heart disease or certain other medical conditions, then you may need to have your cholesterol monitored frequently and be treated with medication.   Ask if you should have a stress test if your history suggests this. A stress test is a test done on a treadmill that looks for heart disease. This test can find disease prior to there being a problem.  Avoid drinking alcohol in excess (more than two drinks per day).  Avoid use of street drugs. Do not share needles with anyone. Ask for professional help if you need assistance or instructions on stopping the use of alcohol, cigarettes, and/or drugs.  Brush your teeth twice a day with fluoride toothpaste, and floss once a day. Good oral hygiene prevents tooth decay and gum disease. The problems can be painful, unattractive, and can cause other health problems. Visit your dentist for a routine oral and dental check up and preventive care every 6-12 months.   Look at your skin regularly.  Use a mirror to look at your back. Notify your caregivers of changes in moles, especially if there are changes in shapes, colors, a size larger than a pencil eraser, an irregular border, or development of new moles.  Safety:  Use seatbelts 100% of the time, whether driving or as a passenger.  Use safety devices such as hearing protection if you work in environments with loud noise or significant background noise.  Use safety glasses when doing any work that could send debris in to the eyes.  Use a helmet if you ride a bike or motorcycle.  Use appropriate safety gear for contact sports.  Talk to your caregiver about gun safety.  Use sunscreen with a SPF (or skin protection factor) of 15 or greater.  Lighter skinned people are at a greater risk of skin cancer. Don't forget to also wear sunglasses in order to protect your eyes from too much damaging sunlight. Damaging sunlight can  accelerate cataract formation.   Practice safe sex. Use condoms. Condoms are used for birth control and to help reduce the spread of sexually transmitted infections (or STIs).  Some of the STIs are gonorrhea (the clap), chlamydia, syphilis, trichomonas, herpes, HPV (human papilloma virus) and HIV (human immunodeficiency virus) which causes AIDS. The herpes, HIV and HPV are viral illnesses that have no cure. These can result in disability, cancer and death.   Keep carbon monoxide and smoke detectors in your home functioning at all times. Change the batteries every 6 months or use a model that plugs into the wall.   Vaccinations:  Stay up to date with your tetanus shots and other required immunizations. You should have a booster for tetanus every 10 years. Be sure to get your flu shot every year, since 5%-20% of the [place] population comes down with the flu. The flu vaccine changes each year, so being vaccinated once is not enough. Get your shot in the fall, before the flu season peaks.   Other vaccines to consider:  Pneumococcal vaccine to protect against certain types of pneumonia.  This is normally recommended for adults age 2 or older.  However, adults younger than 55 years old with certain underlying conditions such as diabetes, heart or lung disease should also receive the vaccine.  Shingles vaccine to protect against Varicella Zoster if you are older than age 39, or younger than 55 years old with certain underlying illness.  Hepatitis A vaccine to protect against a form of infection of the liver by a virus acquired from food.  Hepatitis B vaccine to protect against a form of infection of the liver by a virus acquired from blood or body fluids, particularly if you work in health care.  If you plan to travel internationally, check with your local health department for specific vaccination recommendations.  Cancer Screening:  Most routine colon cancer screening begins at the age of 5. On a  yearly basis, doctors may provide special easy to use take-home tests to check for hidden blood in the stool. Sigmoidoscopy or colonoscopy can detect the earliest forms of colon cancer and is life saving. These tests use a small camera at the end of a tube to directly examine the colon. Speak to your caregiver about this at age 11, when routine screening begins (and is repeated every 5 years unless early forms of pre-cancerous polyps or small growths are found).   At the age of 59 men usually start screening for prostate cancer every year. Screening may begin at a younger age for those with higher risk. Those at higher risk include African-Americans or having a family history of prostate cancer. There are two types of tests for prostate cancer:   Prostate-specific antigen (PSA) testing. Recent studies raise questions about prostate cancer using PSA and you should discuss this with your caregiver.   Digital rectal exam (in which your doctor's lubricated and gloved finger feels for enlargement of the prostate through the anus).   Screening for testicular cancer.  Do a monthly exam of your testicles. Gently roll each testicle between your thumb and fingers, feeling for any abnormal lumps. The best time to do this is after a hot shower or bath when the tissues are looser. Notify your caregivers of any lumps, tenderness or changes in size or shape immediately.

## 2017-03-24 ENCOUNTER — Other Ambulatory Visit: Payer: Self-pay | Admitting: Family Medicine

## 2017-03-24 DIAGNOSIS — E785 Hyperlipidemia, unspecified: Secondary | ICD-10-CM

## 2017-04-01 ENCOUNTER — Other Ambulatory Visit: Payer: Self-pay | Admitting: Family Medicine

## 2017-04-01 DIAGNOSIS — I10 Essential (primary) hypertension: Secondary | ICD-10-CM

## 2017-04-28 ENCOUNTER — Ambulatory Visit (INDEPENDENT_AMBULATORY_CARE_PROVIDER_SITE_OTHER): Payer: BC Managed Care – PPO | Admitting: Specialist

## 2017-05-02 ENCOUNTER — Other Ambulatory Visit: Payer: Self-pay | Admitting: Family Medicine

## 2017-05-02 ENCOUNTER — Ambulatory Visit (INDEPENDENT_AMBULATORY_CARE_PROVIDER_SITE_OTHER): Payer: Self-pay

## 2017-05-02 ENCOUNTER — Encounter (INDEPENDENT_AMBULATORY_CARE_PROVIDER_SITE_OTHER): Payer: Self-pay | Admitting: Specialist

## 2017-05-02 ENCOUNTER — Ambulatory Visit (INDEPENDENT_AMBULATORY_CARE_PROVIDER_SITE_OTHER): Payer: BC Managed Care – PPO | Admitting: Specialist

## 2017-05-02 VITALS — BP 124/83 | HR 110 | Ht 78.25 in | Wt >= 6400 oz

## 2017-05-02 DIAGNOSIS — M48062 Spinal stenosis, lumbar region with neurogenic claudication: Secondary | ICD-10-CM

## 2017-05-02 DIAGNOSIS — G9519 Other vascular myelopathies: Secondary | ICD-10-CM

## 2017-05-02 DIAGNOSIS — M4316 Spondylolisthesis, lumbar region: Secondary | ICD-10-CM

## 2017-05-02 MED ORDER — TRAMADOL HCL 50 MG PO TABS: 100.0000 mg | ORAL_TABLET | Freq: Four times a day (QID) | ORAL | 1 refills | Status: DC | PRN

## 2017-05-02 MED ORDER — DICLOFENAC SODIUM 1 % TD GEL: 2.0000 g | Freq: Four times a day (QID) | TRANSDERMAL | 3 refills | Status: DC

## 2017-05-02 MED ORDER — DICLOFENAC SODIUM 75 MG PO TBEC: 75.0000 mg | DELAYED_RELEASE_TABLET | Freq: Two times a day (BID) | ORAL | 3 refills | Status: DC

## 2017-05-02 NOTE — Addendum Note (Signed)
Addended by: Basil Dess on: 05/02/2017 05:41 PM   Modules accepted: Orders

## 2017-05-02 NOTE — Progress Notes (Addendum)
Office Visit Note   Patient: Dean [name]           Date of Birth: [birth year]           MRN: [id number] Visit Date: 05/02/2017              Requested by: Girtha Rm, NP-C [place], [place] [address] PCP: Girtha Rm, NP-C   Assessment & Plan: Visit Diagnoses:  1. Spondylolisthesis, lumbar region   2. 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 Has had back pain for years, but over the last years has developed for pain in the right buttock and right anterolateral thigh with numbness and paresthesias. His pain pattern is that of neurogenic claudication with decreased standing and walking tolerance. He improves with sitting and then can resume standing again. Xrays suggest anterolisthesis L3-4. There is minimal worsening with forward bending. Will  try activity modification and NSAIDs with flexion exercises first then consider imaging if no improvement. Manual provided for flexion exercises.  Plantar fascitis discussed and provided a prescription for diclofenac gel to be used TID or QID to the left heel.  Heel cord stretching and plantar strengthening recommended.   Plan: Avoid bending, stooping and avoid lifting weights greater than 10 lbs. Avoid prolong standing and walking. Avoid frequent bending and stooping  No lifting greater than 10 lbs. May use ice or [name] heat for pain. Weight loss is of benefit.    Follow-Up Instructions: Return in about 4 weeks (around 05/30/2017).   Orders:  Orders Placed This Encounter  Procedures  . XR Lumb Spine Flex&Ext Only   Meds ordered this encounter  Medications  . diclofenac (VOLTAREN) 75 MG EC tablet    Sig: Take 1 tablet (75 mg total) by mouth 2 (two) times daily.    Dispense:  60 tablet    Refill:  3  . traMADol (ULTRAM) 50 MG tablet    Sig: Take 2 tablets (100 mg total) by mouth every 6 (six) hours as needed for moderate pain.    Dispense:  60  tablet    Refill:  1  . diclofenac sodium (VOLTAREN) 1 % GEL    Sig: Apply 2 g topically 4 (four) times daily.    Dispense:  5 Tube    Refill:  3      Procedures: No procedures performed   Clinical Data: No additional findings.   Subjective: Chief Complaint  Patient presents with  . Lower Back - Pain    56 year old male with at least a year history of back pain and radiation isnto the right hip and right thigh with associated numbness and paresthesias into the right thigh. Pain is worse with standing and walking. He is leaning on carts at the grocery store and at the [employer]. Walking is impaired, can't walk aching pain with prolong standing and improves with sitting.  No bowel or bladder difficulty. Has trouble walking a mile,probably could do.    Review of Systems  HENT: Negative.   Eyes: Negative.   Respiratory: Negative.   Cardiovascular: Negative.   Gastrointestinal: Negative.   Endocrine: Negative.   Genitourinary: Negative.   Musculoskeletal: Positive for back pain and gait problem.  Skin: Negative.   Allergic/Immunologic: Negative.   Neurological: Positive for weakness and numbness.  Hematological: Negative.   Psychiatric/Behavioral: Negative.      Objective: Vital Signs: BP 124/83 (BP Location: Left Arm, Patient Position:  Sitting)   Pulse (!) 110   Ht 6' 6.25" (1.988 m)   Wt (!) 420 lb (190.5 kg)   BMI 48.23 kg/m   Physical Exam  Constitutional: He is oriented to person, place, and time. He appears well-developed and well-nourished.  HENT:  Head: Normocephalic and atraumatic.  Eyes: EOM are normal. Pupils are equal, round, and reactive to light.  Neck: Normal range of motion. Neck supple.  Pulmonary/Chest: Effort normal and breath sounds normal.  Abdominal: Soft. Bowel sounds are normal.  Musculoskeletal: Normal range of motion.  Neurological: He is alert and oriented to person, place, and time.  Skin: Skin is warm and dry.  Psychiatric: He  has a normal mood and affect. His behavior is normal. Judgment and thought content normal.    Ortho Exam  Specialty Comments:  No specialty comments available.  Imaging: No results found.   PMFS History: Patient Active Problem List   Diagnosis Date Noted  . Prediabetes 07/06/2016  . Morbid obesity ([place]) 02/19/2016  . Erectile dysfunction 02/19/2016  . Hyperlipidemia 02/19/2016  . Elevated PSA 02/19/2016  . Low testosterone 02/19/2016  . Essential hypertension 01/22/2016  . History of pulmonary embolism 01/22/2016   Past Medical History:  Diagnosis Date  . Elevated PSA   . History of prediabetes    last A1c 5.6% in March 2018  . Hypertension   . Hypogonadism in male   . Morbid obesity ([place])   . PE (pulmonary embolism) 2014   question testosterone pellets as cause    Family History  Problem Relation Age of Onset  . Hypertension Mother   . Hypertension Father   . Heart disease Father   . Cancer Father   . Prostate cancer Brother 52    Past Surgical History:  Procedure Laterality Date  . JOINT REPLACEMENT     Social History   Occupational History  . Not on file.   Social History Main Topics  . Smoking status: Never Smoker  . Smokeless tobacco: Never Used  . Alcohol use 0.0 oz/week     Comment: 3-4 weekends   . Drug use: No  . Sexual activity: Not on file

## 2017-05-02 NOTE — Patient Instructions (Signed)
Avoid bending, stooping and avoid lifting weights greater than 10 lbs. Avoid prolong standing and walking. Avoid frequent bending and stooping  No lifting greater than 10 lbs. May use ice or moist heat for pain. Weight loss is of benefit.  

## 2017-05-18 ENCOUNTER — Ambulatory Visit (HOSPITAL_BASED_OUTPATIENT_CLINIC_OR_DEPARTMENT_OTHER): Payer: BC Managed Care – PPO | Attending: Family Medicine | Admitting: Internal Medicine

## 2017-05-18 VITALS — Ht 78.0 in | Wt >= 6400 oz

## 2017-05-18 DIAGNOSIS — Z0000 Encounter for general adult medical examination without abnormal findings: Secondary | ICD-10-CM

## 2017-05-18 DIAGNOSIS — G4733 Obstructive sleep apnea (adult) (pediatric): Secondary | ICD-10-CM | POA: Insufficient documentation

## 2017-05-18 DIAGNOSIS — R0683 Snoring: Secondary | ICD-10-CM | POA: Insufficient documentation

## 2017-05-30 ENCOUNTER — Encounter: Payer: Self-pay | Admitting: Family Medicine

## 2017-06-02 ENCOUNTER — Encounter (INDEPENDENT_AMBULATORY_CARE_PROVIDER_SITE_OTHER): Payer: Self-pay | Admitting: Specialist

## 2017-06-02 ENCOUNTER — Ambulatory Visit (INDEPENDENT_AMBULATORY_CARE_PROVIDER_SITE_OTHER): Payer: BC Managed Care – PPO | Admitting: Specialist

## 2017-06-02 ENCOUNTER — Encounter: Payer: Self-pay | Admitting: Internal Medicine

## 2017-06-02 VITALS — BP 149/101 | HR 90 | Ht 78.25 in | Wt >= 6400 oz

## 2017-06-02 DIAGNOSIS — M48062 Spinal stenosis, lumbar region with neurogenic claudication: Secondary | ICD-10-CM

## 2017-06-02 DIAGNOSIS — M4316 Spondylolisthesis, lumbar region: Secondary | ICD-10-CM

## 2017-06-02 DIAGNOSIS — Z791 Long term (current) use of non-steroidal anti-inflammatories (NSAID): Secondary | ICD-10-CM | POA: Diagnosis not present

## 2017-06-02 NOTE — Patient Instructions (Signed)
Avoid bending, stooping and avoid lifting weights greater than 10 lbs. Avoid prolong standing and walking. Avoid frequent bending and stooping  No lifting greater than 10 lbs. May use ice or moist heat for pain. Weight loss is of benefit.

## 2017-06-02 NOTE — Progress Notes (Signed)
Office Visit Note   Patient: Dean [name]           Date of Birth: 19-Mar-1961           MRN: [id number] Visit Date: 06/02/2017              Requested by: Girtha Rm, NP-C [place], [place] [id number] PCP: Girtha Rm, NP-C   Assessment & Plan: Visit Diagnoses:  1. NSAID long-term use   2. Spinal stenosis of lumbar region with neurogenic claudication   3. 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 He is doing well with the diclofenac with very good relief of the plantar fascitis pain. The back and leg pain is better as well. Pain is now back and chronic persistent pain that is  The usual pain. Exam today is pain free, SLR negative, normal neurologic exam, decrease pain with extension compared with previous exam. Wishes to remain on diclofenac For inflamation. We discussed the risks and benefits of this medication and will obtain CMET to assess for any concern of hepatic or renal side effects associated with this medication. Recommend flexion exercises, weight reduction, NSAIDs intermittantly. Will call with the results of the laboratory and see back if pain worsens. Informed that the spondylolisthesis should be rexrayed in 2 years to be sure of no progression.   Plan: Avoid bending, stooping and avoid lifting weights greater than 10 lbs. Avoid prolong standing and walking. Avoid frequent bending and stooping  No lifting greater than 10 lbs. May use ice or moist heat for pain. Weight loss is of benefit.    Follow-Up Instructions: No Follow-up on file.   Orders:  No orders of the defined types were placed in this encounter.  No orders of the defined types were placed in this encounter.     Procedures: No procedures performed   Clinical Data: No additional  findings.   Subjective: Chief Complaint  Patient presents with  . Lower Back - Follow-up  . Left Foot - Follow-up    56 year old male with history of back pain and right leg pain worsening with standing and walking. He  Is coach at [employer] with football team. He also has been experiencing some difficulty with a plantar fascitis. The diclofenac has been successful in relieving much of his foot pain and     Review of Systems   Objective: Vital Signs: BP (!) 149/101 (BP Location: Left Arm, Patient Position: Sitting)   Pulse 90   Ht 6' 6.25" (1.988 m)   Wt (!) 420 lb (190.5 kg)   BMI 48.23 kg/m   Physical Exam  Back Exam   Tenderness  The patient is experiencing tenderness in the lumbar.  Range of Motion  Extension: normal  Flexion: abnormal  Lateral Bend Right: normal  Lateral Bend Left: normal  Rotation Right: normal  Rotation Left: normal   Muscle Strength  Right Quadriceps:  4/5  Left Quadriceps:  4/5  Right Hamstrings:  4/5  Left Hamstrings:  4/5   Tests  Straight leg raise right: negative Straight leg raise left: negative  Reflexes  Patellar: 0/4 Achilles: 0/4 Babinski's sign: normal   Other  Toe Walk: normal Heel Walk: normal Gait: normal   Comments:  No pain with standing and walking into either leg.       Specialty Comments:  No specialty comments available.  Imaging: No results found.   PMFS History: Patient Active Problem List   Diagnosis Date Noted  . Prediabetes 07/06/2016  . Morbid obesity ([place]) 02/19/2016  . Erectile dysfunction 02/19/2016  . Hyperlipidemia 02/19/2016  . Elevated PSA 02/19/2016  . Low testosterone 02/19/2016  . Essential hypertension 01/22/2016  . History of pulmonary embolism 01/22/2016   Past Medical History:  Diagnosis Date  . Elevated PSA   . History of prediabetes    last A1c 5.6% in March 2018  . Hypertension   . Hypogonadism in male   . Morbid obesity ([place])   . PE (pulmonary embolism) 2014    question testosterone pellets as cause    Family History  Problem Relation Age of Onset  . Hypertension Mother   . Hypertension Father   . Heart disease Father   . Cancer Father   . Prostate cancer Brother 22    Past Surgical History:  Procedure Laterality Date  . JOINT REPLACEMENT     Social History   Occupational History  . Not on file.   Social History Main Topics  . Smoking status: Never Smoker  . Smokeless tobacco: Never Used  . Alcohol use 0.0 oz/week     Comment: 3-4 weekends   . Drug use: No  . Sexual activity: Not on file

## 2017-06-03 LAB — COMPREHENSIVE METABOLIC PANEL
ALK PHOS: 65 U/L (ref 40–115)
ALT: 44 U/L (ref 9–46)
AST: 24 U/L (ref 10–35)
Albumin: 4.2 g/dL (ref 3.6–5.1)
BUN: 23 mg/dL (ref 7–25)
CALCIUM: 9.5 mg/dL (ref 8.6–10.3)
CHLORIDE: 101 mmol/L (ref 98–110)
CO2: 27 mmol/L (ref 20–31)
Creat: 1.5 mg/dL — ABNORMAL HIGH (ref 0.70–1.33)
GLUCOSE: 99 mg/dL (ref 65–99)
POTASSIUM: 5 mmol/L (ref 3.5–5.3)
Sodium: 140 mmol/L (ref 135–146)
Total Bilirubin: 0.5 mg/dL (ref 0.2–1.2)
Total Protein: 6.5 g/dL (ref 6.1–8.1)

## 2017-06-04 DIAGNOSIS — G4733 Obstructive sleep apnea (adult) (pediatric): Secondary | ICD-10-CM

## 2017-06-04 NOTE — Procedures (Signed)
Patient Name: Dean [name], Dean [name] Date: 05/18/2017 Gender: Male D.O.B: 1961-10-02 Age (years): 23 Referring Provider: Girtha Rm NP Height (inches): 78 Interpreting Physician: Baird Lyons MD, ABSM Weight (lbs): 420 RPSGT: [place], Holly BMI: 11 MRN: [id number] Neck Size: 20.00 CLINICAL INFORMATION The patient is referred for a split night study with BPAP. MEDICATIONS Medications self-administered by patient taken the night of the study : none reported  SLEEP STUDY TECHNIQUE As per the AASM Manual for the Scoring of Sleep and Associated Events v2.3 (April 2016) with a hypopnea requiring 4% desaturations.  The channels recorded and monitored were frontal, central and occipital EEG, electrooculogram (EOG), submentalis EMG (chin), nasal and oral airflow, thoracic and abdominal wall motion, anterior tibialis EMG, snore microphone, electrocardiogram, and pulse oximetry. Bi-level positive airway pressure (BiPAP) was initiated when the patient met split night criteria and was titrated according to treat sleep-disordered breathing.  RESPIRATORY PARAMETERS Diagnostic  Total AHI (/hr): 76.9 RDI (/hr): 96.6 OA Index (/hr): 29.2 CA Index (/hr): 0.4 REM AHI (/hr): 88.6 NREM AHI (/hr): 74.8 Supine AHI (/hr): 97.9 Non-supine AHI (/hr): 96.44 Min O2 Sat (%): 76.00 Mean O2 (%): 88.29 Time below 88% (min): 70.3   Titration  Optimal IPAP Pressure (cm): 24 Optimal EPAP Pressure (cm): 20 AHI at Optimal Pressure (/hr): 5.3 Min O2 at Optimal Pressure (%): 92.0 Sleep % at Optimal (%): 99 Supine % at Optimal (%): 100      SLEEP ARCHITECTURE The study was initiated at 10:59:30 PM and terminated at 5:23:15 AM. The total recorded time was 383.8 minutes. EEG confirmed total sleep time was 309.5 minutes yielding a sleep efficiency of 80.7%. Sleep onset after lights out was 20.0 minutes with a REM latency of 77.0 minutes. The patient spent 13.41% of the night in stage N1 sleep, 52.67% in stage N2  sleep, 0.16% in stage N3 and 33.76% in REM. Wake after sleep onset (WASO) was 54.3 minutes. The Arousal Index was 56.2/hour.  LEG MOVEMENT DATA The total Periodic Limb Movements of Sleep (PLMS) were 4. The PLMS index was 0.78 .  CARDIAC DATA The 2 lead EKG demonstrated sinus rhythm. The mean heart rate was 83.36 beats per minute. Other EKG findings include: None.  IMPRESSIONS - Severe obstructive sleep apnea occurred during the diagnostic portion of the study (AHI = 76.9 /hour). CPAP did not provide adequate control and was changed to BIPAP. An optimal BiPAP pressure was selected for this patient ( 24 / 20 cm of water) - No significant central sleep apnea occurred during the diagnostic portion of the study (CAI = 0.4/hour). - Moderate oxygen desaturation was noted during the diagnostic portion of the study (Min O2 = 76.00%). - The patient snored with Loud snoring volume during the diagnostic portion of the study. - No cardiac abnormalities were noted during this study. - Clinically significant periodic limb movements of sleep did not occur during the study.  DIAGNOSIS - Obstructive Sleep Apnea (327.23 [G47.33 ICD-10])  RECOMMENDATIONS - Trial of BiPAP therapy on 24/20 cm H2O with a Large size Fisher&Paykel Full Face Mask Simplus mask and heated humidification. - Avoid alcohol, sedatives and other CNS depressants that may worsen sleep apnea and disrupt normal sleep architecture. - Sleep hygiene should be reviewed to assess factors that may improve sleep quality. - Weight management and regular exercise should be initiated or continued.  [Electronically signed] 06/04/2017 10:52 AM  Baird Lyons MD, ABSM Diplomate, American Board of Sleep Medicine   NPI: [id number]  [place], American Board of  Sleep Medicine  ELECTRONICALLY SIGNED ON:  06/04/2017, 10:43 AM [place] PH: [phone]   FX: [phone] [place]

## 2017-06-06 ENCOUNTER — Encounter: Payer: Self-pay | Admitting: Family Medicine

## 2017-06-06 DIAGNOSIS — G4733 Obstructive sleep apnea (adult) (pediatric): Secondary | ICD-10-CM | POA: Insufficient documentation

## 2017-11-24 ENCOUNTER — Other Ambulatory Visit: Payer: Self-pay | Admitting: Family Medicine

## 2017-11-24 DIAGNOSIS — I10 Essential (primary) hypertension: Secondary | ICD-10-CM

## 2018-02-16 ENCOUNTER — Other Ambulatory Visit: Payer: Self-pay

## 2018-02-16 DIAGNOSIS — I10 Essential (primary) hypertension: Secondary | ICD-10-CM

## 2018-02-16 MED ORDER — LISINOPRIL-HYDROCHLOROTHIAZIDE 20-12.5 MG PO TABS: 1.0000 | ORAL_TABLET | Freq: Every day | ORAL | 0 refills | Status: DC

## 2018-03-29 NOTE — Progress Notes (Signed)
Subjective:    Patient ID: Dean [name], male    DOB: Feb 16, 1961, 57 y.o.   MRN: [id number]  HPI Chief Complaint  Patient presents with  . cpe    fasting cpe. no other concerns.gets eyes checked yearly   He is here for a complete physical exam and multiple concerns.    Complains of dyspnea on exertion for the past 2 months. Denies chest pain, palpitations, LE edema, cough. History of borderline echocardiogram. States this was done when he had a PE that was thought to be related to testosterone injections. No recent surgeries. Long distance travel, leg swelling or calf pain.   Severe OSA- using BiPAP nightly and doing well with this. States he is having an issue with his mask and requests a new one.   Stopped taking his statin almost a year ago due to muscle aches. He did not let me know.   Requesting help with weight loss. History of trying multiple weight loss medications including Phentermine last year with minimal weight loss. He has gained more than 20 lbs since his last CPE. States he is unable to exercise due to back pain and right knee pain.   He has seen an orthopedist in the past but reports no relief with oral anti-inflammatories. He was prescribed topical medication as well but this was too expensive and he did not get this.  He would like to see a new orthopedist and inquire about additional treatment options.    Social history: Lives with his wife but he travels back and forth on the weekends to see her, works as a Licensed conveyancer.  Denies smoking, drug use, drinks alcohol -5 drinks per week on average.  Diet: unhealthy  Exercise: unable due to back and knee pain  Immunizations: Tdap up to date. Shingles vaccine recommended.   Health maintenance:  Colonoscopy: never. I have referred him for this in the past and he did not go. States he is not sure when he will have this done.  Last PSA: March 2018 Last Dental Exam: twice annually  Last Eye Exam: in  the past 6 months   Wears seatbelt always, uses sunscreen, smoke detectors in home and functioning, does not text while driving, feels safe in home environment.  Reviewed allergies, medications, past medical, surgical, family, and social history.   Review of Systems Review of Systems Constitutional: -fever, -chills, -sweats, -unexpected weight change,-fatigue ENT: -runny nose, -ear pain, -sore throat Cardiology:  -chest pain, -palpitations, +edema Respiratory: -cough, +shortness of breath, -wheezing Gastroenterology: -abdominal pain, -nausea, -vomiting, -diarrhea, -constipation  Hematology: -bleeding or bruising problems Musculoskeletal: -arthralgias, -myalgias, -joint swelling, +back pain Ophthalmology: -vision changes Urology: -dysuria, -difficulty urinating, -hematuria, -urinary frequency, -urgency Neurology: -headache, -weakness, -tingling, -numbness       Objective:   Physical Exam BP 138/80   Pulse 97   Ht 6\' 6"  (1.981 m)   Wt (!) 443 lb 3.2 oz (201 kg)   BMI 51.22 kg/m   General Appearance:    Alert, cooperative, no distress, morbidly obese, appears stated age  Head:    Normocephalic, without obvious abnormality, atraumatic  Eyes:    PERRL, conjunctiva/corneas clear, EOM's intact, fundi    benign  Ears:    Normal TM's and external ear canals  Nose:   Nares normal, mucosa normal, no drainage or sinus   tenderness  Throat:   Lips, mucosa, and tongue normal; teeth and gums normal  Neck:   Supple, no lymphadenopathy;  thyroid:  no  enlargement/tenderness/nodules; no carotid   bruit or JVD  Back:    Spine nontender, no curvature, ROM normal, no CVA     tenderness  Lungs:     Clear to auscultation bilaterally without wheezes, rales or     ronchi; respirations unlabored  Chest Wall:    No tenderness or deformity   Heart:    Regular rate and rhythm, S1 and S2 normal, no murmur, rub   or gallop  Breast Exam:    No chest wall tenderness, masses or gynecomastia  Abdomen:      Soft, non-tender, nondistended, normoactive bowel sounds,    No palpable masses. Difficult to fully assess due to body habitus  Genitalia:    Declines      Extremities:   No clubbing, cyanosis or edema  Pulses:   2+ and symmetric all extremities  Skin:   Skin color, texture, turgor normal, no rashes or lesions  Lymph nodes:   Cervical, supraclavicular, and axillary nodes normal  Neurologic:   CNII-XII intact, normal strength, sensation and gait; reflexes 2+ and symmetric throughout          Psych:   Normal mood, affect, hygiene and grooming.     Urinalysis dipstick: neg      Assessment & Plan:  Routine general medical examination at a health care facility - Plan: CBC with Differential/Platelet, Comprehensive metabolic panel, PSA, POCT Urinalysis DIP (Proadvantage Device)  Morbid obesity (HCC) - Plan: TSH, Hemoglobin A1c  Prediabetes - Plan: TSH, Hemoglobin A1c  Hyperlipidemia, unspecified hyperlipidemia type - Plan: Lipid panel  OSA (obstructive sleep apnea)  Essential hypertension - Plan: Ambulatory referral to Cardiology  Dyspnea on exertion - Plan: Brain natriuretic peptide, Ambulatory referral to Cardiology, EKG 12-Lead  Screening for prostate cancer - Plan: PSA  Need for hepatitis C screening test - Plan: Hepatitis C antibody  Echocardiogram abnormal - Plan: Ambulatory referral to Cardiology, EKG 12-Lead  ECG unremarkable.  Reviewed echo from 2014 and he had an EF of 40-45 at that time with some mild LV increased wall thickness. New complaint of DOE x 2 months. Unlikely related to PE but did consider this as possible etiology.  Referral to cardiology for further evaluation.  Morbid obesity- He has failed multiple weight loss therapies in the past. Discussed Saxenda and he would like to start on this. No history of pancreatitis, no personal or family history of thyroid disease. First dose given in office and counseled on dosage, potential side effects such as nausea,  diarrhea or constipation. Advised him to stay well hydrated.  He will call me in 1-2 weeks and let me know how he is doing. He is traveling out of town next week.  HTN- close to goal today. Continue on current medication. Weight loss with hopefully improve HTN.  Hyperlipidemia- apparently he is intolerant to statins and he does not want to take one at this time.  Severe OSA on Bipap. He is doing well and benefiting. Continue nightly use. Prescription given to get a new mask.  Chronic back and knee pain. He will follow up with orthopedist.  One time hepatitis C test done per guidelines.  Vaccine counseling done. He will check with insurance regarding Shingrix.  Follow up pending labs or in 4 weeks for weight loss, HTN and other chronic health concerns.

## 2018-03-30 ENCOUNTER — Ambulatory Visit (INDEPENDENT_AMBULATORY_CARE_PROVIDER_SITE_OTHER): Payer: BC Managed Care – PPO | Admitting: Family Medicine

## 2018-03-30 ENCOUNTER — Encounter: Payer: Self-pay | Admitting: Family Medicine

## 2018-03-30 VITALS — BP 138/80 | HR 97 | Ht 78.0 in | Wt >= 6400 oz

## 2018-03-30 DIAGNOSIS — E785 Hyperlipidemia, unspecified: Secondary | ICD-10-CM | POA: Diagnosis not present

## 2018-03-30 DIAGNOSIS — R7303 Prediabetes: Secondary | ICD-10-CM

## 2018-03-30 DIAGNOSIS — G4733 Obstructive sleep apnea (adult) (pediatric): Secondary | ICD-10-CM

## 2018-03-30 DIAGNOSIS — Z0000 Encounter for general adult medical examination without abnormal findings: Secondary | ICD-10-CM | POA: Diagnosis not present

## 2018-03-30 DIAGNOSIS — R0609 Other forms of dyspnea: Secondary | ICD-10-CM

## 2018-03-30 DIAGNOSIS — Z1159 Encounter for screening for other viral diseases: Secondary | ICD-10-CM | POA: Diagnosis not present

## 2018-03-30 DIAGNOSIS — R931 Abnormal findings on diagnostic imaging of heart and coronary circulation: Secondary | ICD-10-CM

## 2018-03-30 DIAGNOSIS — Z125 Encounter for screening for malignant neoplasm of prostate: Secondary | ICD-10-CM

## 2018-03-30 DIAGNOSIS — I10 Essential (primary) hypertension: Secondary | ICD-10-CM

## 2018-03-30 LAB — POCT URINALYSIS DIP (PROADVANTAGE DEVICE)
BILIRUBIN UA: NEGATIVE mg/dL
Bilirubin, UA: NEGATIVE
GLUCOSE UA: NEGATIVE mg/dL
Leukocytes, UA: NEGATIVE
Nitrite, UA: NEGATIVE
PROTEIN UA: NEGATIVE mg/dL
RBC UA: NEGATIVE
SPECIFIC GRAVITY, URINE: 1.01
UUROB: NEGATIVE
pH, UA: 6 (ref 5.0–8.0)

## 2018-03-30 MED ORDER — LIRAGLUTIDE -WEIGHT MANAGEMENT 18 MG/3ML ~~LOC~~ SOPN: PEN_INJECTOR | SUBCUTANEOUS | 1 refills | Status: DC

## 2018-03-30 MED ORDER — LIRAGLUTIDE -WEIGHT MANAGEMENT 18 MG/3ML ~~LOC~~ SOPN: 0.6000 mg | PEN_INJECTOR | Freq: Every day | SUBCUTANEOUS | 1 refills | Status: DC

## 2018-03-30 MED ORDER — LIRAGLUTIDE -WEIGHT MANAGEMENT 18 MG/3ML ~~LOC~~ SOPN
0.6000 mg | PEN_INJECTOR | Freq: Every day | SUBCUTANEOUS | 1 refills | Status: DC
Start: 2018-03-30 — End: 2018-03-30

## 2018-03-30 NOTE — Patient Instructions (Signed)
Start [place] as demonstrated. If you experience any bothersome symptoms call me and do not stop the medication.   You will receive a call from the cardiology office to schedule an appointment.   You can call and schedule with Dr. Sharol Given or Dr. Lorin Mercy at [facility] orthopedics.   Call and schedule your gastroenterology appointment. You are overdue for your screening colonoscopy.   We will call you with your lab results.    Preventative Care for Adults, Male       REGULAR HEALTH EXAMS:  A routine yearly physical is a good way to check in with your primary care provider about your health and preventive screening. It is also an opportunity to share updates about your health and any concerns you have, and receive a thorough all-over exam.   Most health insurance companies pay for at least some preventative services.  Check with your health plan for specific coverages.  WHAT PREVENTATIVE SERVICES DO MEN NEED?  Adult men should have their weight and blood pressure checked regularly.   Men age 79 and older should have their cholesterol levels checked regularly.  Beginning at age 29 and continuing to age 42, men should be screened for colorectal cancer.  Certain people should may need continued testing until age 15.  Other cancer screening may include exams for testicular and prostate cancer.  Updating vaccinations is part of preventative care.  Vaccinations help protect against diseases such as the flu.  Lab tests are generally done as part of preventative care to screen for anemia and blood disorders, to screen for problems with the kidneys and liver, to screen for bladder problems, to check blood sugar, and to check your cholesterol level.  Preventative services generally include counseling about diet, exercise, avoiding tobacco, drugs, excessive alcohol consumption, and sexually transmitted infections.    GENERAL RECOMMENDATIONS FOR GOOD HEALTH:  Healthy diet:  Eat a variety of foods,  including fruit, vegetables, animal or vegetable protein, such as meat, fish, chicken, and eggs, or beans, lentils, tofu, and grains, such as rice.  Drink plenty of water daily.  Decrease saturated fat in the diet, avoid lots of red meat, processed foods, sweets, fast foods, and fried foods.  Exercise:  Aerobic exercise helps maintain good heart health. At least 30-40 minutes of moderate-intensity exercise is recommended. For example, a brisk walk that increases your heart rate and breathing. This should be done on most days of the week.   Find a type of exercise or a variety of exercises that you enjoy so that it becomes a part of your daily life.  Examples are running, walking, swimming, water aerobics, and biking.  For motivation and support, explore group exercise such as aerobic class, spin class, Zumba, Yoga,or  martial arts, etc.    Set exercise goals for yourself, such as a certain weight goal, walk or run in a race such as a 5k walk/run.  Speak to your primary care provider about exercise goals.  Disease prevention:  If you smoke or chew tobacco, find out from your caregiver how to quit. It can literally save your life, no matter how long you have been a tobacco user. If you do not use tobacco, never begin.   Maintain a healthy diet and normal weight. Increased weight leads to problems with blood pressure and diabetes.   The Body Mass Index or BMI is a way of measuring how much of your body is fat. Having a BMI above 27 increases the risk of heart disease,  diabetes, hypertension, stroke and other problems related to obesity. Your caregiver can help determine your BMI and based on it develop an exercise and dietary program to help you achieve or maintain this important measurement at a healthful level.  High blood pressure causes heart and blood vessel problems.  Persistent high blood pressure should be treated with medicine if weight loss and exercise do not work.   Fat and  cholesterol leaves deposits in your arteries that can block them. This causes heart disease and vessel disease elsewhere in your body.  If your cholesterol is found to be high, or if you have heart disease or certain other medical conditions, then you may need to have your cholesterol monitored frequently and be treated with medication.   Ask if you should have a stress test if your history suggests this. A stress test is a test done on a treadmill that looks for heart disease. This test can find disease prior to there being a problem.  Avoid drinking alcohol in excess (more than two drinks per day).  Avoid use of street drugs. Do not share needles with anyone. Ask for professional help if you need assistance or instructions on stopping the use of alcohol, cigarettes, and/or drugs.  Brush your teeth twice a day with fluoride toothpaste, and floss once a day. Good oral hygiene prevents tooth decay and gum disease. The problems can be painful, unattractive, and can cause other health problems. Visit your dentist for a routine oral and dental check up and preventive care every 6-12 months.   Look at your skin regularly.  Use a mirror to look at your back. Notify your caregivers of changes in moles, especially if there are changes in shapes, colors, a size larger than a pencil eraser, an irregular border, or development of new moles.  Safety:  Use seatbelts 100% of the time, whether driving or as a passenger.  Use safety devices such as hearing protection if you work in environments with loud noise or significant background noise.  Use safety glasses when doing any work that could send debris in to the eyes.  Use a helmet if you ride a bike or motorcycle.  Use appropriate safety gear for contact sports.  Talk to your caregiver about gun safety.  Use sunscreen with a SPF (or skin protection factor) of 15 or greater.  Lighter skinned people are at a greater risk of skin cancer. Don't forget to also wear  sunglasses in order to protect your eyes from too much damaging sunlight. Damaging sunlight can accelerate cataract formation.   Practice safe sex. Use condoms. Condoms are used for birth control and to help reduce the spread of sexually transmitted infections (or STIs).  Some of the STIs are gonorrhea (the clap), chlamydia, syphilis, trichomonas, herpes, HPV (human papilloma virus) and HIV (human immunodeficiency virus) which causes AIDS. The herpes, HIV and HPV are viral illnesses that have no cure. These can result in disability, cancer and death.   Keep carbon monoxide and smoke detectors in your home functioning at all times. Change the batteries every 6 months or use a model that plugs into the wall.   Vaccinations:  Stay up to date with your tetanus shots and other required immunizations. You should have a booster for tetanus every 10 years. Be sure to get your flu shot every year, since 5%-20% of the [place] population comes down with the flu. The flu vaccine changes each year, so being vaccinated once is not enough. Get your  shot in the fall, before the flu season peaks.   Other vaccines to consider:  Pneumococcal vaccine to protect against certain types of pneumonia.  This is normally recommended for adults age 46 or older.  However, adults younger than 57 years old with certain underlying conditions such as diabetes, heart or lung disease should also receive the vaccine.  Shingles vaccine to protect against Varicella Zoster if you are older than age 49, or younger than 56 years old with certain underlying illness.  Hepatitis A vaccine to protect against a form of infection of the liver by a virus acquired from food.  Hepatitis B vaccine to protect against a form of infection of the liver by a virus acquired from blood or body fluids, particularly if you work in health care.  If you plan to travel internationally, check with your local health department for specific vaccination  recommendations.  Cancer Screening:  Most routine colon cancer screening begins at the age of 4. On a yearly basis, doctors may provide special easy to use take-home tests to check for hidden blood in the stool. Sigmoidoscopy or colonoscopy can detect the earliest forms of colon cancer and is life saving. These tests use a small camera at the end of a tube to directly examine the colon. Speak to your caregiver about this at age 71, when routine screening begins (and is repeated every 5 years unless early forms of pre-cancerous polyps or small growths are found).   At the age of 88 men usually start screening for prostate cancer every year. Screening may begin at a younger age for those with higher risk. Those at higher risk include African-Americans or having a family history of prostate cancer. There are two types of tests for prostate cancer:   Prostate-specific antigen (PSA) testing. Recent studies raise questions about prostate cancer using PSA and you should discuss this with your caregiver.   Digital rectal exam (in which your doctor's lubricated and gloved finger feels for enlargement of the prostate through the anus).   Screening for testicular cancer.  Do a monthly exam of your testicles. Gently roll each testicle between your thumb and fingers, feeling for any abnormal lumps. The best time to do this is after a hot shower or bath when the tissues are looser. Notify your caregivers of any lumps, tenderness or changes in size or shape immediately.

## 2018-03-31 ENCOUNTER — Encounter: Payer: Self-pay | Admitting: Internal Medicine

## 2018-04-01 LAB — COMPREHENSIVE METABOLIC PANEL
A/G RATIO: 1.8 (ref 1.2–2.2)
ALBUMIN: 4.2 g/dL (ref 3.5–5.5)
ALT: 42 IU/L (ref 0–44)
AST: 26 IU/L (ref 0–40)
Alkaline Phosphatase: 83 IU/L (ref 39–117)
BILIRUBIN TOTAL: 0.7 mg/dL (ref 0.0–1.2)
BUN / CREAT RATIO: 13 (ref 9–20)
BUN: 16 mg/dL (ref 6–24)
CALCIUM: 9.5 mg/dL (ref 8.7–10.2)
CO2: 27 mmol/L (ref 20–29)
Chloride: 95 mmol/L — ABNORMAL LOW (ref 96–106)
Creatinine, Ser: 1.22 mg/dL (ref 0.76–1.27)
GFR, EST AFRICAN AMERICAN: 77 mL/min/{1.73_m2} (ref >59)
GFR, EST NON AFRICAN AMERICAN: 66 mL/min/{1.73_m2} (ref >59)
Globulin, Total: 2.4 g/dL (ref 1.5–4.5)
Glucose: 100 mg/dL — ABNORMAL HIGH (ref 65–99)
POTASSIUM: 4.1 mmol/L (ref 3.5–5.2)
Sodium: 138 mmol/L (ref 134–144)
TOTAL PROTEIN: 6.6 g/dL (ref 6.0–8.5)

## 2018-04-01 LAB — CBC WITH DIFFERENTIAL/PLATELET
BASOS: 0 %
Basophils Absolute: 0 10*3/uL (ref 0.0–0.2)
EOS (ABSOLUTE): 0.1 10*3/uL (ref 0.0–0.4)
EOS: 1 %
HEMATOCRIT: 41.8 % (ref 37.5–51.0)
HEMOGLOBIN: 14.1 g/dL (ref 13.0–17.7)
IMMATURE GRANS (ABS): 0 10*3/uL (ref 0.0–0.1)
IMMATURE GRANULOCYTES: 0 %
LYMPHS: 21 %
Lymphocytes Absolute: 1.5 10*3/uL (ref 0.7–3.1)
MCH: 29.3 pg (ref 26.6–33.0)
MCHC: 33.7 g/dL (ref 31.5–35.7)
MCV: 87 fL (ref 79–97)
MONOCYTES: 9 %
Monocytes Absolute: 0.6 10*3/uL (ref 0.1–0.9)
NEUTROS ABS: 4.8 10*3/uL (ref 1.4–7.0)
NEUTROS PCT: 69 %
Platelets: 228 10*3/uL (ref 150–379)
RBC: 4.82 x10E6/uL (ref 4.14–5.80)
RDW: 14.1 % (ref 12.3–15.4)
WBC: 7 10*3/uL (ref 3.4–10.8)

## 2018-04-01 LAB — TSH: TSH: 2.5 u[IU]/mL (ref 0.450–4.500)

## 2018-04-01 LAB — LIPID PANEL
CHOL/HDL RATIO: 5.6 ratio — AB (ref 0.0–5.0)
Cholesterol, Total: 237 mg/dL — ABNORMAL HIGH (ref 100–199)
HDL: 42 mg/dL (ref >39)
LDL CALC: 155 mg/dL — AB (ref 0–99)
Triglycerides: 199 mg/dL — ABNORMAL HIGH (ref 0–149)
VLDL Cholesterol Cal: 40 mg/dL (ref 5–40)

## 2018-04-01 LAB — BRAIN NATRIURETIC PEPTIDE: BNP: 9.4 pg/mL (ref 0.0–100.0)

## 2018-04-01 LAB — HEMOGLOBIN A1C
ESTIMATED AVERAGE GLUCOSE: 146 mg/dL
Hgb A1c MFr Bld: 6.7 % — ABNORMAL HIGH (ref 4.8–5.6)

## 2018-04-01 LAB — PSA: PROSTATE SPECIFIC AG, SERUM: 4.1 ng/mL — AB (ref 0.0–4.0)

## 2018-04-01 LAB — HEPATITIS C ANTIBODY

## 2018-04-05 ENCOUNTER — Encounter: Payer: Self-pay | Admitting: Family Medicine

## 2018-04-05 NOTE — Progress Notes (Signed)
   Subjective:    Patient ID: Dean [name], male    DOB: [birth year], 57 y.o.   MRN: [id number]  HPI Chief Complaint  Patient presents with  . discuss lab results    discuss lab results   He is here to follow up on abnormal labs obtained at his CPE last week. His hemoglobin A1c is 6.7% and he now has diabetes. Previously he was in the prediabetes range.   LDL is elevated at 155 and he stopped taking simvastatin due to muscle aches approximately one year ago.  His ASCVD 10 year risk is currently 19.2%.   Started him on Saxenda last week for weight loss. He reports doing well on this. States his pharmacy told him he needs a prior [place].   PSA slightly elevated. Prostate cancer in father and brother. Has not seen a urologist since moving to [place] 3 years ago.   Denies fever, chills, chest pain, palpitations, shortness of breath, abdominal pain, N/V/D, urinary symptoms.   Reviewed allergies, medications, past medical, surgical, family, and social history.   Review of Systems Pertinent positives and negatives in the history of present illness.     Objective:   Physical Exam BP 120/80   Pulse 99   Alert and oriented and in no acute distress. Not otherwise examined.       Assessment & Plan:  New onset type 2 diabetes mellitus (HCC)  Hyperlipidemia, unspecified hyperlipidemia type - Plan: atorvastatin (LIPITOR) 10 MG tablet  Morbid obesity (HCC)  Elevated PSA - Plan: Ambulatory referral to Urology  Essential hypertension  OSA (obstructive sleep apnea)  Family history of prostate cancer in father - Plan: Ambulatory referral to Urology  Discussed diagnosis, treatment and potential complications associated with diabetes.  Counseling done on how to properly check blood sugars.  Diabetic meter and supplies provided with demonstration by my CMA. Continue on lisinopril-HCTZ. BP well controlled.  He reportedly had muscle aches with simvastatin. He is in agreement to try a  different one. Start him on low dose atorvastatin.  Continue with weight loss medication Saxenda.  He is aware that having diabetes increases his risk for heart disease and other health conditions.  He will see a cardiologist in 2 weeks as planned.  PSA is slightly elevated and he has a history of elevated PSA. Brother and father with prostate cancer. He last [place] a urologist in another state and has not established with one here.  Referral made to urology.  Follow up in 4 weeks. Bring in blood sugar readings.

## 2018-04-06 ENCOUNTER — Encounter: Payer: Self-pay | Admitting: Family Medicine

## 2018-04-06 ENCOUNTER — Ambulatory Visit: Payer: BC Managed Care – PPO | Admitting: Family Medicine

## 2018-04-06 VITALS — BP 120/80 | HR 99

## 2018-04-06 DIAGNOSIS — R972 Elevated prostate specific antigen [PSA]: Secondary | ICD-10-CM | POA: Diagnosis not present

## 2018-04-06 DIAGNOSIS — E785 Hyperlipidemia, unspecified: Secondary | ICD-10-CM | POA: Diagnosis not present

## 2018-04-06 DIAGNOSIS — I10 Essential (primary) hypertension: Secondary | ICD-10-CM | POA: Diagnosis not present

## 2018-04-06 DIAGNOSIS — G4733 Obstructive sleep apnea (adult) (pediatric): Secondary | ICD-10-CM

## 2018-04-06 DIAGNOSIS — E119 Type 2 diabetes mellitus without complications: Secondary | ICD-10-CM | POA: Diagnosis not present

## 2018-04-06 DIAGNOSIS — Z8042 Family history of malignant neoplasm of prostate: Secondary | ICD-10-CM | POA: Diagnosis not present

## 2018-04-06 MED ORDER — ATORVASTATIN CALCIUM 10 MG PO TABS: 10.0000 mg | ORAL_TABLET | Freq: Every day | ORAL | 2 refills | Status: DC

## 2018-04-06 MED ORDER — GLUCOSE BLOOD VI STRP: ORAL_STRIP | 1 refills | Status: DC

## 2018-04-06 NOTE — Patient Instructions (Addendum)
Start the cholesterol medication as prescribed.  Call and let me know if you experience any muscle aches or symptoms.  Continue on all of your current medications.  You have a new diagnosis of diabetes. Go to the American Diabetes Association website for and read about this if you would like.   Start checking your blood sugar at least 3 times weekly in the mornings.  These will be fasting blood sugars (nothing to eat or drink except water for 6 hours at least). Fasting blood sugar goal is 80-130  Keep a log of your blood sugars and bring these into your appointment with me in 4-5 weeks.  Reduce the amount of carbohydrates you are eating (potatoes, rice, pasta, bread) If you drink any sugary drinks then cut back significantly.  See the cardiologist as scheduled.  I am referring you to a urologist for further evaluation of mildly elevated PSA due to your family history. You should receive a phone call from [facility] in the next week or 2.  Mickel Baas, in the front office, will be working on your prior authorization for Saxenda.  If you have any questions about this you can call and ask for her or Gabriel Cirri if she is not here.    Diabetes Mellitus and Nutrition When you have diabetes (diabetes mellitus), it is very important to have healthy eating habits because your blood sugar (glucose) levels are greatly affected by what you eat and drink. Eating healthy foods in the appropriate amounts, at about the same times every day, can help you:  Control your blood glucose.  Lower your risk of heart disease.  Improve your blood pressure.  Reach or maintain a healthy weight.  Every person with diabetes is different, and each person has different needs for a meal plan. Your health care provider may recommend that you work with a diet and nutrition specialist (dietitian) to make a meal plan that is best for you. Your meal plan may vary depending on factors such as:  The calories you  need.  The medicines you take.  Your weight.  Your blood glucose, blood pressure, and cholesterol levels.  Your activity level.  Other health conditions you have, such as heart or kidney disease.  How do carbohydrates affect me? Carbohydrates affect your blood glucose level more than any other type of food. Eating carbohydrates naturally increases the amount of glucose in your blood. Carbohydrate counting is a method for keeping track of how many carbohydrates you eat. Counting carbohydrates is important to keep your blood glucose at a healthy level, especially if you use insulin or take certain oral diabetes medicines. It is important to know how many carbohydrates you can safely have in each meal. This is different for every person. Your dietitian can help you calculate how many carbohydrates you should have at each meal and for snack. Foods that contain carbohydrates include:  Bread, cereal, rice, pasta, and crackers.  Potatoes and corn.  Peas, beans, and lentils.  Milk and yogurt.  Fruit and juice.  Desserts, such as cakes, cookies, ice cream, and candy.  How does alcohol affect me? Alcohol can cause a sudden decrease in blood glucose (hypoglycemia), especially if you use insulin or take certain oral diabetes medicines. Hypoglycemia can be a life-threatening condition. Symptoms of hypoglycemia (sleepiness, dizziness, and confusion) are similar to symptoms of having too much alcohol. If your health care provider says that alcohol is safe for you, follow these guidelines:  Limit alcohol intake to no more than  1 drink per day for nonpregnant women and 2 drinks per day for men. One drink equals 12 oz of beer, 5 oz of wine, or 1 oz of hard liquor.  Do not drink on an empty stomach.  Keep yourself hydrated with water, diet soda, or unsweetened iced tea.  Keep in mind that regular soda, juice, and other mixers may contain a lot of sugar and must be counted as  carbohydrates.  What are tips for following this plan? Reading food labels  Start by checking the serving size on the label. The amount of calories, carbohydrates, fats, and other nutrients listed on the label are based on one serving of the food. Many foods contain more than one serving per package.  Check the total grams (g) of carbohydrates in one serving. You can calculate the number of servings of carbohydrates in one serving by dividing the total carbohydrates by 15. For example, if a food has 30 g of total carbohydrates, it would be equal to 2 servings of carbohydrates.  Check the number of grams (g) of saturated and trans fats in one serving. Choose foods that have low or no amount of these fats.  Check the number of milligrams (mg) of sodium in one serving. Most people should limit total sodium intake to less than 2,300 mg per day.  Always check the nutrition information of foods labeled as "low-fat" or "nonfat". These foods may be higher in added sugar or refined carbohydrates and should be avoided.  Talk to your dietitian to identify your daily goals for nutrients listed on the label. Shopping  Avoid buying canned, premade, or processed foods. These foods tend to be high in fat, sodium, and added sugar.  Shop around the outside edge of the grocery store. This includes fresh fruits and vegetables, bulk grains, fresh meats, and fresh dairy. Cooking  Use low-heat cooking methods, such as baking, instead of high-heat cooking methods like deep frying.  Cook using healthy oils, such as olive, canola, or sunflower oil.  Avoid cooking with butter, cream, or high-fat meats. Meal planning  Eat meals and snacks regularly, preferably at the same times every day. Avoid going long periods of time without eating.  Eat foods high in fiber, such as fresh fruits, vegetables, beans, and whole grains. Talk to your dietitian about how many servings of carbohydrates you can eat at each  meal.  Eat 4-6 ounces of lean protein each day, such as lean meat, chicken, fish, eggs, or tofu. 1 ounce is equal to 1 ounce of meat, chicken, or fish, 1 egg, or 1/4 cup of tofu.  Eat some foods each day that contain healthy fats, such as avocado, nuts, seeds, and fish. Lifestyle   Check your blood glucose regularly.  Exercise at least 30 minutes 5 or more days each week, or as told by your health care provider.  Take medicines as told by your health care provider.  Do not use any products that contain nicotine or tobacco, such as cigarettes and e-cigarettes. If you need help quitting, ask your health care provider.  Work with a Social worker or diabetes educator to identify strategies to manage stress and any emotional and social challenges. What are some questions to ask my health care provider?  Do I need to meet with a diabetes educator?  Do I need to meet with a dietitian?  What number can I call if I have questions?  When are the best times to check my blood glucose? Where to find  more information:  American Diabetes Association: diabetes.org/food-and-fitness/food  Academy of Nutrition and Dietetics: PokerClues.dk  [employer] of Diabetes and Digestive and Kidney Diseases (NIH): ContactWire.be Summary  A healthy meal plan will help you control your blood glucose and maintain a healthy lifestyle.  Working with a diet and nutrition specialist (dietitian) can help you make a meal plan that is best for you.  Keep in mind that carbohydrates and alcohol have immediate effects on your blood glucose levels. It is important to count carbohydrates and to use alcohol carefully. This information is not intended to replace advice given to you by your health care provider. Make sure you discuss any questions you have with your health care provider. Document  Released: 09/02/2005 Document Revised: 01/10/2017 Document Reviewed: 01/10/2017 Elsevier Interactive Patient Education  2018 [employer].   Diabetes Mellitus and Standards of Medical Care Managing diabetes (diabetes mellitus) can be complicated. Your diabetes treatment may be managed by a team of health care providers, including:  A diet and nutrition specialist (registered dietitian).  A nurse.  A certified diabetes educator (CDE).  A diabetes specialist (endocrinologist).  An eye doctor.  A primary care provider.  A dentist.  Your health care providers follow a schedule in order to help you get the best quality of care. The following schedule is a general guideline for your diabetes management plan. Your health care providers may also give you more specific instructions. HbA1c ( hemoglobin A1c) test This test provides information about blood sugar (glucose) control over the previous 2-3 months. It is used to check whether your diabetes management plan needs to be adjusted.  If you are meeting your treatment goals, this test is done at least 2 times a year.  If you are not meeting treatment goals or if your treatment goals have changed, this test is done 4 times a year.  Blood pressure test  This test is done at every routine medical visit. For most people, the goal is less than 130/80. Ask your health care provider what your goal blood pressure should be. Dental and eye exams  Visit your dentist two times a year.  If you have type 1 diabetes, get an eye exam 3-5 years after you are diagnosed, and then once a year after your first exam. ? If you were diagnosed with type 1 diabetes as a child, get an eye exam when you are age 62 or older and have had diabetes for 3-5 years. After the first exam, you should get an eye exam once a year.  If you have type 2 diabetes, have an eye exam as soon as you are diagnosed, and then once a year after your first exam. Foot care  exam  Visual foot exams are done at every routine medical visit. The exams check for cuts, bruises, redness, blisters, sores, or other problems with the feet.  A complete foot exam is done by your health care provider once a year. This exam includes an inspection of the structure and skin of your feet, and a check of the pulses and sensation in your feet. ? Type 1 diabetes: Get your first exam 3-5 years after diagnosis. ? Type 2 diabetes: Get your first exam as soon as you are diagnosed.  Check your feet every day for cuts, bruises, redness, blisters, or sores. If you have any of these or other problems that are not healing, contact your health care provider. Kidney function test ( urine microalbumin)  This test is done once a  year. ? Type 1 diabetes: Get your first test 5 years after diagnosis. ? Type 2 diabetes: Get your first test as soon as you are diagnosed.  If you have chronic kidney disease (CKD), get a serum creatinine and estimated glomerular filtration rate (eGFR) test once a year. Lipid profile (cholesterol, HDL, LDL, triglycerides)  This test should be done when you are diagnosed with diabetes, and every 5 years after the first test. If you are on medicines to lower your cholesterol, you may need to get this test done every year. ? The goal for LDL is less than 100 mg/dL (5.5 mmol/L). If you are at high risk, the goal is less than 70 mg/dL (3.9 mmol/L). ? The goal for HDL is 40 mg/dL (2.2 mmol/L) for men and 50 mg/dL(2.8 mmol/L) for women. An HDL cholesterol of 60 mg/dL (3.3 mmol/L) or higher gives some protection against heart disease. ? The goal for triglycerides is less than 150 mg/dL (8.3 mmol/L). Immunizations  The yearly flu (influenza) vaccine is recommended for everyone 6 months or older who has diabetes.  The pneumonia (pneumococcal) vaccine is recommended for everyone 2 years or older who has diabetes. If you are 45 or older, you may get the pneumonia vaccine as a  series of two separate shots.  The hepatitis B vaccine is recommended for adults shortly after they have been diagnosed with diabetes.  The Tdap (tetanus, diphtheria, and pertussis) vaccine should be given: ? According to normal childhood vaccination schedules, for children. ? Every 10 years, for adults who have diabetes.  The shingles vaccine is recommended for people who have had chicken pox and are 50 years or older. Mental and emotional health  Screening for symptoms of eating disorders, anxiety, and depression is recommended at the time of diagnosis and afterward as needed. If your screening shows that you have symptoms (you have a positive screening result), you may need further evaluation and be referred to a mental health care provider. Diabetes self-management education  Education about how to manage your diabetes is recommended at diagnosis and ongoing as needed. Treatment plan  Your treatment plan will be reviewed at every medical visit. Summary  Managing diabetes (diabetes mellitus) can be complicated. Your diabetes treatment may be managed by a team of health care providers.  Your health care providers follow a schedule in order to help you get the best quality of care.  Standards of care including having regular physical exams, blood tests, blood pressure monitoring, immunizations, screening tests, and education about how to manage your diabetes.  Your health care providers may also give you more specific instructions based on your individual health. This information is not intended to replace advice given to you by your health care provider. Make sure you discuss any questions you have with your health care provider. Document Released: 10/03/2009 Document Revised: 09/03/2016 Document Reviewed: 09/03/2016 Elsevier Interactive Patient Education  [employer].

## 2018-04-07 ENCOUNTER — Telehealth: Payer: Self-pay | Admitting: Family Medicine

## 2018-04-07 NOTE — Telephone Encounter (Signed)
P.A. SAXENDA  

## 2018-04-11 ENCOUNTER — Encounter: Payer: Self-pay | Admitting: Family Medicine

## 2018-04-15 NOTE — Telephone Encounter (Signed)
P.A. Approved til 08/08/18

## 2018-04-17 NOTE — Telephone Encounter (Signed)
Called pt and no answer, unable to leave message and no answer on wife's phone

## 2018-05-04 ENCOUNTER — Ambulatory Visit: Payer: Self-pay | Admitting: Family Medicine

## 2018-05-09 ENCOUNTER — Other Ambulatory Visit: Payer: Self-pay | Admitting: Family Medicine

## 2018-05-09 DIAGNOSIS — I10 Essential (primary) hypertension: Secondary | ICD-10-CM

## 2018-05-09 NOTE — Telephone Encounter (Signed)
Sent a refill back on 2/28. Pt shouldn't be out until 5/28 and pt has an appt tomorrow so we will refill at that time

## 2018-05-10 ENCOUNTER — Encounter: Payer: Self-pay | Admitting: Family Medicine

## 2018-05-10 ENCOUNTER — Ambulatory Visit (INDEPENDENT_AMBULATORY_CARE_PROVIDER_SITE_OTHER): Payer: BC Managed Care – PPO | Admitting: Family Medicine

## 2018-05-10 ENCOUNTER — Other Ambulatory Visit: Payer: Self-pay | Admitting: Family Medicine

## 2018-05-10 DIAGNOSIS — E119 Type 2 diabetes mellitus without complications: Secondary | ICD-10-CM

## 2018-05-10 DIAGNOSIS — I10 Essential (primary) hypertension: Secondary | ICD-10-CM | POA: Diagnosis not present

## 2018-05-10 DIAGNOSIS — H8110 Benign paroxysmal vertigo, unspecified ear: Secondary | ICD-10-CM | POA: Diagnosis not present

## 2018-05-10 MED ORDER — LISINOPRIL-HYDROCHLOROTHIAZIDE 20-12.5 MG PO TABS: 1.0000 | ORAL_TABLET | Freq: Every day | ORAL | 1 refills | Status: DC

## 2018-05-10 MED ORDER — GLUCOSE BLOOD VI STRP: ORAL_STRIP | 1 refills | Status: DC

## 2018-05-10 MED ORDER — LANCETS 30G MISC: 1 refills | Status: DC

## 2018-05-10 MED ORDER — ONETOUCH VERIO W/DEVICE KIT: 1.0000 | PACK | Freq: Every day | 0 refills | Status: DC

## 2018-05-10 MED ORDER — BLOOD GLUCOSE TEST VI STRP: ORAL_STRIP | 1 refills | Status: DC

## 2018-05-10 MED ORDER — ONETOUCH ULTRASOFT LANCETS MISC: 1 refills | Status: DC

## 2018-05-10 MED ORDER — BLOOD GLUCOSE METER KIT
PACK | 0 refills | Status: DC
Start: 2018-05-10 — End: 2018-05-10

## 2018-05-10 MED ORDER — MECLIZINE HCL 25 MG PO TABS: 25.0000 mg | ORAL_TABLET | Freq: Two times a day (BID) | ORAL | 0 refills | Status: DC | PRN

## 2018-05-10 NOTE — Patient Instructions (Signed)
You can try meclizine to see if this helps. If not or if your symptoms get worse then call or return to see me.   Continue on your current medications.   Check your blood sugars 2-3 times per week to make sure they are staying well controlled.       Benign Positional Vertigo Vertigo is the feeling that you or your surroundings are moving when they are not. Benign positional vertigo is the most common form of vertigo. The cause of this condition is not serious (is benign). This condition is triggered by certain movements and positions (is positional). This condition can be dangerous if it occurs while you are doing something that could endanger you or others, such as driving. What are the causes? In many cases, the cause of this condition is not known. It may be caused by a disturbance in an area of the inner ear that helps your brain to sense movement and balance. This disturbance can be caused by a viral infection (labyrinthitis), head injury, or repetitive motion. What increases the risk? This condition is more likely to develop in:  Women.  People who are 44 years of age or older.  What are the signs or symptoms? Symptoms of this condition usually happen when you move your head or your eyes in different directions. Symptoms may start suddenly, and they usually last for less than a minute. Symptoms may include:  Loss of balance and falling.  Feeling like you are spinning or moving.  Feeling like your surroundings are spinning or moving.  Nausea and vomiting.  Blurred vision.  Dizziness.  Involuntary eye movement (nystagmus).  Symptoms can be mild and cause only slight annoyance, or they can be severe and interfere with daily life. Episodes of benign positional vertigo may return (recur) over time, and they may be triggered by certain movements. Symptoms may improve over time. How is this diagnosed? This condition is usually diagnosed by medical history and a physical exam of  the head, neck, and ears. You may be referred to a health care provider who specializes in ear, nose, and throat (ENT) problems (otolaryngologist) or a provider who specializes in disorders of the nervous system (neurologist). You may have additional testing, including:  MRI.  A CT scan.  Eye movement tests. Your health care provider may ask you to change positions quickly while he or she watches you for symptoms of benign positional vertigo, such as nystagmus. Eye movement may be tested with an electronystagmogram (ENG), caloric stimulation, the Dix-Hallpike test, or the roll test.  An electroencephalogram (EEG). This records electrical activity in your brain.  Hearing tests.  How is this treated? Usually, your health care provider will treat this by moving your head in specific positions to adjust your inner ear back to normal. Surgery may be needed in severe cases, but this is rare. In some cases, benign positional vertigo may resolve on its own in 2-4 weeks. Follow these instructions at home: Safety  Move slowly.Avoid sudden body or head movements.  Avoid driving.  Avoid operating heavy machinery.  Avoid doing any tasks that would be dangerous to you or others if a vertigo episode would occur.  If you have trouble walking or keeping your balance, try using a cane for stability. If you feel dizzy or unstable, sit down right away.  Return to your normal activities as told by your health care provider. Ask your health care provider what activities are safe for you. General instructions  Take over-the-counter  and prescription medicines only as told by your health care provider.  Avoid certain positions or movements as told by your health care provider.  Drink enough fluid to keep your urine clear or pale yellow.  Keep all follow-up visits as told by your health care provider. This is important. Contact a health care provider if:  You have a fever.  Your condition gets worse  or you develop new symptoms.  Your family or friends notice any behavioral changes.  Your nausea or vomiting gets worse.  You have numbness or a "pins and needles" sensation. Get help right away if:  You have difficulty speaking or moving.  You are always dizzy.  You faint.  You develop severe headaches.  You have weakness in your legs or arms.  You have changes in your hearing or vision.  You develop a stiff neck.  You develop sensitivity to light. This information is not intended to replace advice given to you by your health care provider. Make sure you discuss any questions you have with your health care provider. Document Released: 09/13/2006 Document Revised: 05/13/2016 Document Reviewed: 03/31/2015 Elsevier Interactive Patient Education  [employer].

## 2018-05-10 NOTE — Progress Notes (Signed)
   Subjective:    Patient ID: Dean [name], male    DOB: December 27, 1960, 57 y.o.   MRN: [id number]  HPI Chief Complaint  Patient presents with  . follow-up    follow-up on weight loss med and HTN   He is here to follow up on multiple chronic health conditions.   New onset type 2 DM with a Hgb A1c 6.7%. He has made healthy lifestyle changes. Checking BS at home and readings have been mostly in goal range. No low BS and no hyperglycemia.   Reports weight loss since starting Saxenda. He reports doing quite well and has lost 17 lbs. Denies any side effects. Would like to stay on medication.  HTN- reports good compliance on medication and no side effects.   He has a new complaint of intermittent dizziness with positions changes for the past 2 months. This is occurring approximately 2-5 times per week. Lasts only a few seconds and resolves spontaneously.  He has associated nausea with dizziness.  Denies history of vertigo.  Denies fever, chills, headaches, vision changes, numbness, tingling or weakness. No chest pain, palpitations, abdominal pain, vomiting or diarrhea.   States he has a cardiologist appointment next week   Reports having a urologist tomorrow for elevated PSA. Harrell Gave Winter at [facility] urology.   Reviewed allergies, medications, past medical, surgical, family, and social history.   Review of Systems Pertinent positives and negatives in the history of present illness.     Objective:   Physical Exam BP 120/80   Pulse 78   Ht 6\' 6"  (1.981 m)   Wt (!) 426 lb 6.4 oz (193.4 kg)   BMI 49.28 kg/m   Alert and in no distress. Tympanic membranes and canals are normal. Pharyngeal area is normal. Neck is supple without adenopathy or thyromegaly. Cardiac exam shows a regular sinus rhythm without murmurs or gallops. Lungs are clear to auscultation. PERRLA, EOMs intact, no nystagmus. No facial asymmetry, pronator drift. Normal finger to nose. CN II-IX intact. Negative Romberg.  Skin is warm and dry, no pallor.      Assessment & Plan:  Morbid obesity ([place])  Essential hypertension - Plan: lisinopril-hydrochlorothiazide (PRINZIDE,ZESTORETIC) 20-12.5 MG tablet  Benign paroxysmal positional vertigo, unspecified laterality - Plan: meclizine (ANTIVERT) 25 MG tablet  New onset type 2 diabetes mellitus ([place])  He is doing well and has lost 17 lbs since starting Saxenda. No side effects and plans to stay on medication.  He brought in his blood sugars and these are mostly in goal range. He has made healthy lifestyle changes and appears to be feeling better overall. Recommend he check his blood sugars 2-3 times per week.  BP is in goal range. Continue on lisinopril/HCTZ.  Good statin compliance and no side effects.  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 Normal neuro exam and dizziness is not interfering with his usual activities. He will report back any new or worsening symptoms.  Seeing urology tomorrow for elevated PSA.  Follow up with cardiology next week.  Encouraged him to get his colonoscopy.  Follow up for fasting diabetes check the end of July. He reports August is not doable due to his job.

## 2018-05-16 ENCOUNTER — Ambulatory Visit: Payer: BC Managed Care – PPO | Admitting: Physician Assistant

## 2018-05-16 ENCOUNTER — Encounter: Payer: Self-pay | Admitting: Physician Assistant

## 2018-05-16 VITALS — BP 136/94 | HR 103 | Ht 78.0 in | Wt >= 6400 oz

## 2018-05-16 DIAGNOSIS — R0609 Other forms of dyspnea: Secondary | ICD-10-CM | POA: Diagnosis not present

## 2018-05-16 DIAGNOSIS — R42 Dizziness and giddiness: Secondary | ICD-10-CM

## 2018-05-16 NOTE — Progress Notes (Signed)
Cardiology Office Note   Date:  05/16/2018   ID:  Dean [name], DOB March 10, 1961, MRN [id number]  PCP:  Girtha Rm, NP-C  Cardiologist: New, Dr Dean Gardner, PA-C   Chief Complaint  Patient presents with  . Establish Care  . Hypertension  . Shortness of Breath    History of Present Illness: Dean [name] is a 57 y.o. male with a history of DM, HTN, HLD intol Zocor, morbid obesity, remote hx PE (felt secondary to testosterone pellets), elevated PSA OSA on BiPAP, chronic back pain, hypogonadism  5/22 PCP office visit, dizziness and nausea, possible BPPV, started on meclizine Previously complained of dyspnea on exertion.  Dean [name] presents for cardiology evaluation.   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 He has back problems from that, had a knee problem in 2015, not repaired. Because of the MS injuries, has not been exercising. 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  He never gets chest pain.  He c/o DOE, but the DM med Kirke Shaggy is recent, he has lost 17 lbs in the last month and is breathing some better.  He still does have some dyspnea on exertion, but assigns it to deconditioning.  He does not wake with LE edema, does not have orthopnea or PND. Is compliant w/ BiPAP qhs.   He has not had the dizziness since being put on the meclizine, has not taken it.  He has never had palpitations.  He denies near syncope.  He has not had chest pain with exertion.  His weight is trending down.  He is not currently on hormone therapy, but after the PE, is not interested in it.  He does not check his blood pressure at home.  He is not aware that his heart rate runs high.   Past Medical History:  Diagnosis Date  . Elevated PSA   . Hypertension   . Hypogonadism in male   . Morbid obesity ([place])   . New onset type 2 diabetes mellitus ([place]) 07/06/2016  . PE (pulmonary  embolism) 2014   question testosterone pellets as cause    Past Surgical History:  Procedure Laterality Date  . JOINT REPLACEMENT Right    x 2     Current Outpatient Medications  Medication Sig Dispense Refill  . atorvastatin (LIPITOR) 10 MG tablet Take 1 tablet (10 mg total) by mouth daily. 30 tablet 2  . Blood Glucose Monitoring Suppl (ONETOUCH VERIO) w/Device KIT 1 each by Other route daily. 1 kit 0  . Glucose Blood (BLOOD GLUCOSE TEST STRIPS) STRP Test once day. Pt needs a contour plus one meter. 100 each 1  . glucose blood (ONETOUCH VERIO) test strip Test once a day. Pt will use a one touch verio meter 100 each 1  . Lancets (ONETOUCH ULTRASOFT) lancets Test once a day. Pt will use a one touch verio meter 100 each 1  . lisinopril-hydrochlorothiazide (PRINZIDE,ZESTORETIC) 20-12.5 MG tablet Take 1 tablet by mouth daily. 90 tablet 1  . meclizine (ANTIVERT) 25 MG tablet Take 1 tablet (25 mg total) by mouth 2 (two) times daily as needed for dizziness. 30 tablet 0  . Multiple Vitamin (MULTIVITAMIN) tablet Take 1 tablet by mouth daily.    Marland Kitchen SAXENDA 18 MG/3ML SOPN Inject 1 Dose as directed daily.  1   No current facility-administered medications for this visit.  Allergies:   Penicillins    Social History:  The patient  reports that he has never smoked. He has never used smokeless tobacco. He reports that he drinks alcohol. He reports that he does not use drugs.   Family History:  The patient's family history includes Cancer in his father; Dementia in his father; Heart disease in his father; Hypertension in his father and mother; Prostate cancer (age of onset: 6) in his brother.    ROS:  Please see the history of present illness. All other systems are reviewed and negative.    PHYSICAL EXAM: VS:  BP (!) 136/94 (BP Location: Left Arm)   Pulse (!) 103   Ht 6' 6"  (1.981 m)   Wt (!) 426 lb 12.8 oz (193.6 kg)   BMI 49.32 kg/m  , BMI Body mass index is 49.32 kg/m. GEN: Well  nourished, well developed, male in no acute distress  HEENT: normal for age  Neck: no JVD, no carotid bruit, no masses Cardiac: RRR; no murmur, no rubs, or gallops Respiratory:  clear to auscultation bilaterally, normal work of breathing GI: soft, nontender, nondistended, + BS MS: no deformity or atrophy; no edema; distal pulses are 2+ in all 4 extremities   Skin: warm and dry, no rash Neuro:  Strength and sensation are intact Psych: euthymic mood, full affect   EKG:  EKG is ordered today. The ekg ordered today demonstrates sinus tachycardia, heart rate 103, no ischemic changes, no pathologic Q waves, normal intervals.   Recent Labs: 03/30/2018: ALT 42; BNP 9.4; BUN 16; Creatinine, Ser 1.22; Hemoglobin 14.1; Platelets 228; Potassium 4.1; Sodium 138; TSH 2.500    Lipid Panel    Component Value Date/Time   CHOL 237 (H) 03/30/2018 1530   TRIG 199 (H) 03/30/2018 1530   HDL 42 03/30/2018 1530   CHOLHDL 5.6 (H) 03/30/2018 1530   CHOLHDL 3.9 03/15/2017 0826   VLDL 23 03/15/2017 0826   LDLCALC 155 (H) 03/30/2018 1530     Wt Readings from Last 3 Encounters:  05/16/18 (!) 426 lb 12.8 oz (193.6 kg)  05/10/18 (!) 426 lb 6.4 oz (193.4 kg)  03/30/18 (!) 443 lb 3.2 oz (201 kg)     Other studies Reviewed: Additional studies/ records that were reviewed today include: Office notes and testing.  ASSESSMENT AND PLAN: The patient and plan were reviewed with Dr. Gwenlyn Found, who agrees  1.  Dyspnea on exertion: His symptoms have improved some since he lost weight.  He is encouraged to continue this. -However, he is at high risk to have diastolic dysfunction because of his elevated blood pressure. -Go ahead and check an echocardiogram, if he has diastolic dysfunction even if pressures are normal, it will help guide blood pressure treatment.  2.  Sleep apnea: Continue BiPAP per PCP  3.  Hypertension: His heart rate is also elevated.  He denies caffeine use.  He is not lightheaded or dizzy.  His  blood pressure is slightly above target, but if he continues to lose weight, that will improve.  4.  Diabetes: Compliance with a low-sodium diabetic diet is encouraged.   Current medicines are reviewed at length with the patient today.  The patient does not have concerns regarding medicines.  The following changes have been made:  no change  Labs/ tests ordered today include:   Orders Placed This Encounter  Procedures  . EKG 12-Lead  . ECHOCARDIOGRAM COMPLETE     Disposition:   FU with Dr. Gwenlyn Found if the echo is  abnormal, otherwise as needed  Signed, Rosaria Ferries, PA-C  05/16/2018 5:08 PM    [place] Phone: [phone]; Fax: [phone]  This note was written with the assistance of speech recognition software. Please excuse any transcriptional errors.

## 2018-05-16 NOTE — Patient Instructions (Signed)
Medication Instructions: Your physician recommends that you continue on your current medications as directed. Please refer to the Current Medication list given to you today.  If you need a refill on your cardiac medications before your next appointment, please call your pharmacy.     Procedures/Testing: Your physician has requested that you have an echocardiogram. Echocardiography is a painless test that uses sound waves to create images of your heart. It provides your doctor with information about the size and shape of your heart and how well your heart's chambers and valves are working. This procedure takes approximately one hour. There are no restrictions for this procedure. [place] [address]   Follow-Up: Your physician wants you to follow-up Dr. Gwenlyn Found as needed.  Special Instructions: Continue to monitor Blood Pressures. Let PCP know if is greater than 130/90   Thank you for choosing [facility] at [facility]!!

## 2018-05-30 ENCOUNTER — Other Ambulatory Visit (HOSPITAL_COMMUNITY): Payer: BC Managed Care – PPO

## 2018-06-05 ENCOUNTER — Other Ambulatory Visit: Payer: Self-pay | Admitting: Family Medicine

## 2018-06-20 ENCOUNTER — Telehealth: Payer: Self-pay | Admitting: Family Medicine

## 2018-06-20 NOTE — Telephone Encounter (Signed)
[name] nurse with [facility] called and she is talking with pt about Diabetes and her t# [phone].  She sets goals and gives him information and see's if he needs anything .  Michela Pitcher you can call her if you have any questions

## 2018-07-02 ENCOUNTER — Other Ambulatory Visit: Payer: Self-pay | Admitting: Family Medicine

## 2018-07-02 DIAGNOSIS — E785 Hyperlipidemia, unspecified: Secondary | ICD-10-CM

## 2018-07-16 ENCOUNTER — Encounter: Payer: Self-pay | Admitting: Family Medicine

## 2018-07-16 NOTE — Progress Notes (Signed)
Subjective:    Patient ID: Dean [name], male    DOB: 1961/07/23, 56 y.o.   MRN: [id number]  Dean [name] is a 57 y.o. male who presents for follow-up of Type 2 diabetes mellitus.  He did see a urologist for elevated PSA.  States the urologist recommended a biopsy of his prostate.  Patient states he chose to do watchful waiting and would like to have his PSA repeated today.  States his brother was diagnosed with prostate cancer and had a biopsy done and told the patient that he wished he had just kept an eye on his blood work.  The patient states he is following his brother's advice on this.  Colonoscopy- he did not schedule.   Taking Saxenda and has lost weight. Weight is down 15 lbs since May 2019.  Reports excellent compliance with medication and no side effects.  He would like to continue on the medication.  States he is feeling better with his overall health including chronic knee pain and the ability to move in general.  He is fasting. Is taking his statin without any difficulties.   Using CPAP nightly. Doing well.   Patient is checking home blood sugars.   Home blood sugar records: meter record How often is blood sugars being checked: q week Current symptoms include: none Patient denies hyperglycemia, hypoglycemia , increased appetite, nausea, paresthesia of the feet, polydipsia, polyuria and visual disturbances.  Patient {is checking their feet daily. Any Foot concerns (callous, ulcer, wound, thickened nails, toenail fungus, skin fungus, hammer toe): no Last dilated eye exam: one year has appt for follow up this week  Current treatments: Saxenda for weight loss. Medication compliance: excellent  Current diet: in general, a "healthy" diet   Current exercise: coaching (college football coach) Known diabetic complications: none  The following portions of the patient's history were reviewed and updated as appropriate: allergies, current medications, past medical history, past  social history and problem list.  ROS as in subjective above.     Objective:    Physical Exam Alert and in no distress otherwise not examined.  Blood pressure 128/80, pulse (!) 102, temperature 98.4 F (36.9 C), height 6\' 7"  (2.007 m), weight (!) 411 lb 12.8 oz (186.8 kg), SpO2 94 %.  Lab Review Diabetic Labs Latest Ref Rng & Units 03/30/2018 06/02/2017 03/15/2017 02/24/2017 07/06/2016  HbA1c 4.8 - 5.6 % 6.7(H) - - 5.6 5.8%  Chol 100 - 199 mg/dL 237(H) - 188 - -  HDL >39 mg/dL 42 - 48 - -  Calc LDL 0 - 99 mg/dL 155(H) - 117(H) - -  Triglycerides 0 - 149 mg/dL 199(H) - 116 - -  Creatinine 0.76 - 1.27 mg/dL 1.22 1.50(H) 1.27 1.34(H) -   BP/Weight 07/17/2018 05/16/2018 05/10/2018 04/06/2018 9/51/8841  Systolic BP 660 630 160 109 323  Diastolic BP 80 94 80 80 80  Wt. (Lbs) 411.8 426.8 426.4 - 443.2  BMI 46.39 49.32 49.28 - 51.22   No flowsheet data found.  Dean [name]  reports that he has never smoked. He has never used smokeless tobacco. He reports that he drinks alcohol. He reports that he does not use drugs.     Assessment & Plan:    Controlled type 2 diabetes mellitus without complication, without long-term current use of insulin ([facility]) - Plan: CBC with Differential/Platelet, Comprehensive metabolic panel, Hemoglobin A1c  Hyperlipidemia, unspecified hyperlipidemia type - Plan: Lipid panel  Morbid obesity ([facility])  Elevated PSA - Plan: PSA  Essential hypertension - Plan: CBC  with Differential/Platelet, Comprehensive metabolic panel  OSA (obstructive sleep apnea)  1. Rx changes: none Hgb A1c 5.6%  2. Education: Reviewed 'ABCs' of diabetes management (respective goals in parentheses):  A1C (<7), blood pressure (<130/80), and cholesterol (LDL <100). 3. Compliance at present is estimated to be good. Efforts to improve compliance (if necessary) will be directed at dietary modifications: continue to cut back on portion sizes, carbohydrates and sugar. , increased exercise and regular blood  sugar monitoring: 1-2 times weekly. 4. HTN- BP is doing well. Continue current medication.  5. Hyperlipidemia- good daily statin compliance. Will check fasting lipids today.  6. OSA on CPAP and doing well. Good compliance and is benefiting. Continue on this. 7. Continue Saxenda for weight loss. This is improving diabetes, HTN, and overall health. He is feeling great on this medication and no side effects.  8. PSA recheck today. He does not wish to have a prostate biopsy at this point and would like to monitor.   9. Follow up: 5 months

## 2018-07-17 ENCOUNTER — Ambulatory Visit: Payer: BC Managed Care – PPO | Admitting: Family Medicine

## 2018-07-17 ENCOUNTER — Encounter: Payer: Self-pay | Admitting: Family Medicine

## 2018-07-17 VITALS — BP 128/80 | HR 102 | Temp 98.4°F | Ht 79.0 in | Wt >= 6400 oz

## 2018-07-17 DIAGNOSIS — I10 Essential (primary) hypertension: Secondary | ICD-10-CM

## 2018-07-17 DIAGNOSIS — R972 Elevated prostate specific antigen [PSA]: Secondary | ICD-10-CM | POA: Diagnosis not present

## 2018-07-17 DIAGNOSIS — E785 Hyperlipidemia, unspecified: Secondary | ICD-10-CM

## 2018-07-17 DIAGNOSIS — G4733 Obstructive sleep apnea (adult) (pediatric): Secondary | ICD-10-CM

## 2018-07-17 DIAGNOSIS — E119 Type 2 diabetes mellitus without complications: Secondary | ICD-10-CM

## 2018-07-17 NOTE — Patient Instructions (Signed)
Congratulations on weight loss. Continue on current medications and healthy eating habits.   Your hemoglobin A1c has improved to 5.6% which is considered to be normal. We will recheck this as your follow up appointment in 5-6 months.   We will call with your lab results.

## 2018-07-18 ENCOUNTER — Other Ambulatory Visit: Payer: Self-pay | Admitting: Family Medicine

## 2018-07-18 LAB — COMPREHENSIVE METABOLIC PANEL
ALBUMIN: 4.3 g/dL (ref 3.5–5.5)
ALT: 29 IU/L (ref 0–44)
AST: 15 IU/L (ref 0–40)
Albumin/Globulin Ratio: 2.2 (ref 1.2–2.2)
Alkaline Phosphatase: 81 IU/L (ref 39–117)
BUN/Creatinine Ratio: 12 (ref 9–20)
BUN: 17 mg/dL (ref 6–24)
Bilirubin Total: 0.5 mg/dL (ref 0.0–1.2)
CHLORIDE: 102 mmol/L (ref 96–106)
CO2: 25 mmol/L (ref 20–29)
CREATININE: 1.37 mg/dL — AB (ref 0.76–1.27)
Calcium: 9.6 mg/dL (ref 8.7–10.2)
GFR calc Af Amer: 67 mL/min/{1.73_m2} (ref >59)
GFR calc non Af Amer: 58 mL/min/{1.73_m2} — ABNORMAL LOW (ref >59)
GLUCOSE: 103 mg/dL — AB (ref 65–99)
Globulin, Total: 2 g/dL (ref 1.5–4.5)
Potassium: 4.8 mmol/L (ref 3.5–5.2)
Sodium: 142 mmol/L (ref 134–144)
TOTAL PROTEIN: 6.3 g/dL (ref 6.0–8.5)

## 2018-07-18 LAB — HEMOGLOBIN A1C
Est. average glucose Bld gHb Est-mCnc: 114 mg/dL
HEMOGLOBIN A1C: 5.6 % (ref 4.8–5.6)

## 2018-07-18 LAB — CBC WITH DIFFERENTIAL/PLATELET
BASOS ABS: 0 10*3/uL (ref 0.0–0.2)
Basos: 0 %
EOS (ABSOLUTE): 0.1 10*3/uL (ref 0.0–0.4)
Eos: 1 %
Hematocrit: 43.5 % (ref 37.5–51.0)
Hemoglobin: 14.2 g/dL (ref 13.0–17.7)
IMMATURE GRANS (ABS): 0 10*3/uL (ref 0.0–0.1)
Immature Granulocytes: 0 %
Lymphocytes Absolute: 1.1 10*3/uL (ref 0.7–3.1)
Lymphs: 16 %
MCH: 28.7 pg (ref 26.6–33.0)
MCHC: 32.6 g/dL (ref 31.5–35.7)
MCV: 88 fL (ref 79–97)
Monocytes Absolute: 0.5 10*3/uL (ref 0.1–0.9)
Monocytes: 7 %
NEUTROS ABS: 5.2 10*3/uL (ref 1.4–7.0)
Neutrophils: 76 %
Platelets: 240 10*3/uL (ref 150–450)
RBC: 4.95 x10E6/uL (ref 4.14–5.80)
RDW: 12.9 % (ref 12.3–15.4)
WBC: 6.9 10*3/uL (ref 3.4–10.8)

## 2018-07-18 LAB — LIPID PANEL
CHOLESTEROL TOTAL: 186 mg/dL (ref 100–199)
Chol/HDL Ratio: 5.2 ratio — ABNORMAL HIGH (ref 0.0–5.0)
HDL: 36 mg/dL — ABNORMAL LOW (ref >39)
LDL CALC: 120 mg/dL — AB (ref 0–99)
Triglycerides: 150 mg/dL — ABNORMAL HIGH (ref 0–149)
VLDL CHOLESTEROL CAL: 30 mg/dL (ref 5–40)

## 2018-07-18 LAB — PSA: Prostate Specific Ag, Serum: 4.7 ng/mL — ABNORMAL HIGH (ref 0.0–4.0)

## 2018-07-18 MED ORDER — ATORVASTATIN CALCIUM 20 MG PO TABS: 20.0000 mg | ORAL_TABLET | Freq: Every day | ORAL | 4 refills | Status: DC

## 2018-08-05 ENCOUNTER — Other Ambulatory Visit: Payer: Self-pay | Admitting: Family Medicine

## 2018-08-07 ENCOUNTER — Other Ambulatory Visit: Payer: Self-pay | Admitting: Family Medicine

## 2018-08-07 NOTE — Telephone Encounter (Signed)
Can you check on this?

## 2018-08-07 NOTE — Telephone Encounter (Signed)
saxenda is not covered by [place]. If you want pt to be on this, you could probably get an appeal letter to see if that will work.  Pt previously had [employer] and it would cover med

## 2018-08-09 LAB — SPECIMEN STATUS REPORT

## 2018-08-09 LAB — PSA, TOTAL AND FREE
PSA, Free Pct: 14.7 %
PSA, Free: 0.69 ng/mL
Prostate Specific Ag, Serum: 4.7 ng/mL — ABNORMAL HIGH (ref 0.0–4.0)

## 2018-08-12 ENCOUNTER — Encounter: Payer: Self-pay | Admitting: Family Medicine

## 2018-08-12 ENCOUNTER — Telehealth: Payer: Self-pay | Admitting: Family Medicine

## 2018-08-12 NOTE — Telephone Encounter (Signed)
Initiated P.A. SAXENDA

## 2018-08-12 NOTE — Telephone Encounter (Signed)
Called pharmacy back because the P.A. They sent is thru the old insurance but it did submit to the insurance company.  But went ahead and got information for [employer] t# [phone] ID#[id number], I called and they are closed today.   So submitted P.A. [employer] online

## 2018-08-24 ENCOUNTER — Other Ambulatory Visit: Payer: Self-pay | Admitting: *Deleted

## 2018-08-24 MED ORDER — LIRAGLUTIDE -WEIGHT MANAGEMENT 18 MG/3ML ~~LOC~~ SOPN: 0.6000 mg | PEN_INJECTOR | Freq: Every day | SUBCUTANEOUS | 0 refills | Status: DC

## 2018-08-24 NOTE — Telephone Encounter (Signed)
Handled.Marland KitchenMarland KitchenMarland KitchenSee  P.A. Telephone call

## 2018-08-24 NOTE — Telephone Encounter (Signed)
P.A. Kirke Shaggy denied, this is plan exclusion.  States no matter what the reason that it is being requested it is not based on medical necessity.  The P.A. Was approved thru [place].  I called pt and he states he doesn't have [employer] and he picked up the medication last week and it went thru for $24

## 2018-08-24 NOTE — Telephone Encounter (Signed)
Handled, pt has [employer] and was approved

## 2018-08-24 NOTE — Telephone Encounter (Signed)
Please refill this for him for 3 months.

## 2018-08-24 NOTE — Telephone Encounter (Signed)
You just need to send in refill now

## 2018-10-12 ENCOUNTER — Other Ambulatory Visit: Payer: Self-pay | Admitting: Family Medicine

## 2018-11-13 ENCOUNTER — Other Ambulatory Visit: Payer: Self-pay | Admitting: Family Medicine

## 2018-11-14 MED ORDER — LIRAGLUTIDE -WEIGHT MANAGEMENT 18 MG/3ML ~~LOC~~ SOPN: 0.6000 mg | PEN_INJECTOR | Freq: Every day | SUBCUTANEOUS | 0 refills | Status: DC

## 2018-11-14 NOTE — Telephone Encounter (Signed)
Is this okay to send in? 

## 2018-11-14 NOTE — Telephone Encounter (Signed)
Ok to refill until his follow up appt as long as he is doing well, taking this daily still ? Dose? Side effects? and please ask how his weight is looking. This needs to be documented in order for insurance to continue paying.

## 2018-11-14 NOTE — Telephone Encounter (Signed)
Pt is taking it daily. Pt now weighs around 385. And taking 3mg .

## 2018-11-30 ENCOUNTER — Other Ambulatory Visit: Payer: Self-pay | Admitting: Internal Medicine

## 2018-11-30 DIAGNOSIS — I10 Essential (primary) hypertension: Secondary | ICD-10-CM

## 2018-11-30 MED ORDER — LISINOPRIL-HYDROCHLOROTHIAZIDE 20-12.5 MG PO TABS: 1.0000 | ORAL_TABLET | Freq: Every day | ORAL | 1 refills | Status: DC

## 2018-12-16 ENCOUNTER — Telehealth: Payer: Self-pay | Admitting: Family Medicine

## 2018-12-16 NOTE — Telephone Encounter (Signed)
P.A. SAXENDA RENEWAL  °

## 2018-12-28 ENCOUNTER — Ambulatory Visit: Payer: BC Managed Care – PPO | Admitting: Family Medicine

## 2018-12-28 ENCOUNTER — Encounter: Payer: Self-pay | Admitting: Family Medicine

## 2018-12-28 VITALS — BP 124/80 | HR 84 | Ht 79.0 in | Wt >= 6400 oz

## 2018-12-28 DIAGNOSIS — I10 Essential (primary) hypertension: Secondary | ICD-10-CM

## 2018-12-28 DIAGNOSIS — E785 Hyperlipidemia, unspecified: Secondary | ICD-10-CM

## 2018-12-28 DIAGNOSIS — Z23 Encounter for immunization: Secondary | ICD-10-CM | POA: Diagnosis not present

## 2018-12-28 DIAGNOSIS — R972 Elevated prostate specific antigen [PSA]: Secondary | ICD-10-CM

## 2018-12-28 DIAGNOSIS — R7989 Other specified abnormal findings of blood chemistry: Secondary | ICD-10-CM

## 2018-12-28 DIAGNOSIS — Z1211 Encounter for screening for malignant neoplasm of colon: Secondary | ICD-10-CM

## 2018-12-28 DIAGNOSIS — E119 Type 2 diabetes mellitus without complications: Secondary | ICD-10-CM

## 2018-12-28 DIAGNOSIS — G4733 Obstructive sleep apnea (adult) (pediatric): Secondary | ICD-10-CM

## 2018-12-28 LAB — POCT GLYCOSYLATED HEMOGLOBIN (HGB A1C): HEMOGLOBIN A1C: 5.6 % (ref 4.0–5.6)

## 2018-12-28 MED ORDER — LISINOPRIL-HYDROCHLOROTHIAZIDE 20-12.5 MG PO TABS: 1.0000 | ORAL_TABLET | Freq: Every day | ORAL | 1 refills | Status: DC

## 2018-12-28 MED ORDER — LIRAGLUTIDE -WEIGHT MANAGEMENT 18 MG/3ML ~~LOC~~ SOPN: 3.0000 mg | PEN_INJECTOR | Freq: Every day | SUBCUTANEOUS | 1 refills | Status: DC

## 2018-12-28 NOTE — Progress Notes (Signed)
Subjective:    Patient ID: Dean [name], male    DOB: Oct 19, 1961, 58 y.o.   MRN: [id number]  Chief Complaint  Patient presents with  . diabetes    diabetes on saxenda    Dean [name] is a 58 y.o. male who presents for follow-up of Type 2 diabetes mellitus and other chronic health conditions.   Morbid obesity- has has been on Saxenda since April 2019. Has lost 39 lbs since starting the medication.  States his weight was 388 lbs before the holidays.  No side effects. Doing well and would like to stay on the medication.   HTN- reports good medication compliance. No side effects.   Statin compliance is good per patient. No side effects.   OSA on CPAP- uses this nightly, no issues.   History of elevated PSA. He has seen his urologist and a biopsy was recommended. He is choosing to do watchful waiting and requests recheck of PSA today.   History of low testosterone and is not treating this. History of hypogonadism and was treated with seed implants until he had PE which was thought to be related to testosterone replacement therapy.   Colonoscopy- did not get this done but is willing to do the Cologuard test. He seems to be of average risk for colon cancer. Asymptomatic.    Patient is checking home blood sugars.   Home blood sugar records: BGs range between 84 and 102 How often is blood sugars being checked: every couple weeks Current symptoms include: none. Patient denies increased appetite, nausea, visual disturbances and vomiting.  Patient is checking their feet daily. Any Foot concerns (callous, ulcer, wound, thickened nails, toenail fungus, skin fungus, hammer toe): no concerns Last dilated eye exam: Dec 2019- [facility]  Current treatments: he is not on medication currently for diabetes. He is taking Saxenda for weight loss.  Medication compliance: good  Current diet: in general, a "healthy" diet   Current exercise: walking Known diabetic complications: none  The following  portions of the patient's history were reviewed and updated as appropriate: allergies, current medications, past medical history, past social history and problem list.  ROS as in subjective above.     Objective:    Physical Exam Alert and oriented and in no distress.  Pharyngeal area is normal. Neck is supple without adenopathy or thyromegaly. Cardiac exam shows a regular sinus rhythm without murmurs or gallops. Lungs are clear to auscultation.  Extremities without edema, pulses intact.  Normal foot exam.  Skin is warm and dry.   Blood pressure 124/80, pulse 84, height 6\' 7"  (2.007 m), weight (!) 404 lb 6.4 oz (183.4 kg).  Lab Review Diabetic Labs Latest Ref Rng & Units 12/28/2018 07/17/2018 03/30/2018 06/02/2017 03/15/2017  HbA1c 4.0 - 5.6 % 5.6 5.6 6.7(H) - -  Chol 100 - 199 mg/dL - 186 237(H) - 188  HDL >39 mg/dL - 36(L) 42 - 48  Calc LDL 0 - 99 mg/dL - 120(H) 155(H) - 117(H)  Triglycerides 0 - 149 mg/dL - 150(H) 199(H) - 116  Creatinine 0.76 - 1.27 mg/dL - 1.37(H) 1.22 1.50(H) 1.27   BP/Weight 12/28/2018 07/17/2018 05/16/2018 05/10/2018 8/84/1660  Systolic BP 630 160 109 323 557  Diastolic BP 80 80 94 80 80  Wt. (Lbs) 404.4 411.8 426.8 426.4 -  BMI 45.56 46.39 49.32 49.28 -   Foot/eye exam completion dates 12/28/2018  Foot Form Completion Done    Debbie  reports that he has never smoked. He has never used smokeless tobacco.  He reports current alcohol use. He reports that he does not use drugs.     Assessment & Plan:    Controlled type 2 diabetes mellitus without complication, without long-term current use of insulin ([place]) - Plan: CBC with Differential/Platelet, Comprehensive metabolic panel, TSH, T4, free, Microalbumin / creatinine urine ratio, HgB A1c  Essential hypertension - Plan: lisinopril-hydrochlorothiazide (PRINZIDE,ZESTORETIC) 20-12.5 MG tablet, CBC with Differential/Platelet, Comprehensive metabolic panel  OSA (obstructive sleep apnea)  Hyperlipidemia, unspecified  hyperlipidemia type - Plan: Lipid panel  Morbid obesity ([place])  Low testosterone - Plan: Testosterone  Elevated PSA - Plan: PSA  Screen for colon cancer - Plan: Cologuard  Needs flu shot - Plan: Flu Vaccine QUAD 36+ mos IM  Need for pneumococcal vaccination - Plan: Pneumococcal conjugate vaccine 13-valent  1. Rx changes: none Hgb A1c 5.6%  2. Education: Reviewed 'ABCs' of diabetes management (respective goals in parentheses):  A1C (<7), blood pressure (<130/80), and cholesterol (LDL <100). 3. Compliance at present is estimated to be good. Efforts to improve compliance (if necessary) will be directed at dietary modifications: Limiting his carbohydrates and fatty foods and increased exercise. 4. Hypertension-well-controlled on current medication regimen.  Continue medications.  Counseling on low-sodium diet and continued weight loss 5. Morbid obesity-continue on Saxenda.  He is on the maximum dose and not having any side effects.  He is pleased with medication and he has had successful amount of weight loss. 6. OSA on CPAP.  Reports excellent compliance and benefiting from this.  No compliance report in the system.  We will request this. 7. Hyperlipidemia-doing well on statin.  No side effects.  Continue on this and continue with weight loss 8. Elevated PSA and has seen urology for this.  He is choosing to do watchful waiting and PSA recheck today per patient request 9. History of hypogonadism.  Recheck testosterone level 10. He has refused colonoscopy for the past couple of years.  He does agree to do the [employer].  This will be ordered.  We attempted to call his insurance company to find out coverage however after being on hold for 45 minutes we hung up. 11. Immunization counseling done.  Flu shot and Prevnar 13 were given.  He is aware that if he would like to get the Shingrix vaccine that he can check with his insurance regards to cost and schedule a nurse visit to get this. 12. Reports  being up-to-date on eye exam.  We will attempt to get this 13. Urine microalbumin ordered 14. Foot exam done 15. Follow up: 6 months for CPE and follow-up on diabetes and other health conditions.

## 2018-12-28 NOTE — Patient Instructions (Addendum)
Your hemoglobin A1c is 5.6% and diabetes is well controlled.  Your blood pressure is in normal range as well.  Continue on your medication.  Continue taking your statin daily.  We will check your cholesterol today.  Continue on the Saxenda 3 mg dosing.  Continue using your CPAP.  You will receive the Cologuard testing supplies and instructions in the mail.  Please follow the instructions and return them as directed.  We will call you with lab results.  Return to see me in July for your CPE and follow-up.

## 2018-12-29 ENCOUNTER — Telehealth: Payer: Self-pay | Admitting: Internal Medicine

## 2018-12-29 LAB — COMPREHENSIVE METABOLIC PANEL
ALT: 39 IU/L (ref 0–44)
AST: 20 IU/L (ref 0–40)
Albumin/Globulin Ratio: 2 (ref 1.2–2.2)
Albumin: 4.4 g/dL (ref 3.5–5.5)
Alkaline Phosphatase: 89 IU/L (ref 39–117)
BUN/Creatinine Ratio: 12 (ref 9–20)
BUN: 16 mg/dL (ref 6–24)
Bilirubin Total: 0.7 mg/dL (ref 0.0–1.2)
CO2: 25 mmol/L (ref 20–29)
Calcium: 9.8 mg/dL (ref 8.7–10.2)
Chloride: 98 mmol/L (ref 96–106)
Creatinine, Ser: 1.35 mg/dL — ABNORMAL HIGH (ref 0.76–1.27)
GFR calc Af Amer: 67 mL/min/{1.73_m2} (ref >59)
GFR calc non Af Amer: 58 mL/min/{1.73_m2} — ABNORMAL LOW (ref >59)
Globulin, Total: 2.2 g/dL (ref 1.5–4.5)
Glucose: 92 mg/dL (ref 65–99)
Potassium: 4.8 mmol/L (ref 3.5–5.2)
Sodium: 139 mmol/L (ref 134–144)
Total Protein: 6.6 g/dL (ref 6.0–8.5)

## 2018-12-29 LAB — CBC WITH DIFFERENTIAL/PLATELET
BASOS ABS: 0 10*3/uL (ref 0.0–0.2)
Basos: 0 %
EOS (ABSOLUTE): 0.1 10*3/uL (ref 0.0–0.4)
Eos: 2 %
Hematocrit: 42.8 % (ref 37.5–51.0)
Hemoglobin: 14.5 g/dL (ref 13.0–17.7)
IMMATURE GRANS (ABS): 0 10*3/uL (ref 0.0–0.1)
IMMATURE GRANULOCYTES: 0 %
LYMPHS: 19 %
Lymphocytes Absolute: 1.4 10*3/uL (ref 0.7–3.1)
MCH: 29.7 pg (ref 26.6–33.0)
MCHC: 33.9 g/dL (ref 31.5–35.7)
MCV: 88 fL (ref 79–97)
Monocytes Absolute: 0.5 10*3/uL (ref 0.1–0.9)
Monocytes: 8 %
NEUTROS PCT: 71 %
Neutrophils Absolute: 5 10*3/uL (ref 1.4–7.0)
Platelets: 227 10*3/uL (ref 150–450)
RBC: 4.89 x10E6/uL (ref 4.14–5.80)
RDW: 12.9 % (ref 11.6–15.4)
WBC: 7 10*3/uL (ref 3.4–10.8)

## 2018-12-29 LAB — MICROALBUMIN / CREATININE URINE RATIO
CREATININE, UR: 76.9 mg/dL
Microalb/Creat Ratio: 11.8 mg/g creat (ref 0.0–30.0)
Microalbumin, Urine: 9.1 ug/mL

## 2018-12-29 LAB — LIPID PANEL
Chol/HDL Ratio: 3.2 ratio (ref 0.0–5.0)
Cholesterol, Total: 156 mg/dL (ref 100–199)
HDL: 49 mg/dL (ref >39)
LDL Calculated: 87 mg/dL (ref 0–99)
Triglycerides: 102 mg/dL (ref 0–149)
VLDL Cholesterol Cal: 20 mg/dL (ref 5–40)

## 2018-12-29 LAB — TSH: TSH: 2.94 u[IU]/mL (ref 0.450–4.500)

## 2018-12-29 LAB — TESTOSTERONE: Testosterone: 225 ng/dL — ABNORMAL LOW (ref 264–916)

## 2018-12-29 LAB — PSA: Prostate Specific Ag, Serum: 6.7 ng/mL — ABNORMAL HIGH (ref 0.0–4.0)

## 2018-12-29 LAB — T4, FREE: Free T4: 1.25 ng/dL (ref 0.82–1.77)

## 2018-12-29 NOTE — Telephone Encounter (Signed)
Called [employer] to get compliance report but pt gets his cpap stuff from [facility] out of [place].. [phone] option 1. They will get with cpap department and fax over a download. fyi

## 2018-12-30 NOTE — Telephone Encounter (Signed)
P.A. approved til 12/18/19, pt informed, faxed pharmacy

## 2019-01-02 ENCOUNTER — Encounter: Payer: Self-pay | Admitting: Family Medicine

## 2019-01-24 LAB — COLOGUARD: Cologuard: NEGATIVE

## 2019-02-02 ENCOUNTER — Encounter: Payer: Self-pay | Admitting: Internal Medicine

## 2019-03-28 ENCOUNTER — Other Ambulatory Visit: Payer: Self-pay | Admitting: Family Medicine

## 2019-04-01 ENCOUNTER — Other Ambulatory Visit: Payer: Self-pay | Admitting: Family Medicine

## 2019-04-02 NOTE — Telephone Encounter (Signed)
appt in july

## 2019-04-26 DIAGNOSIS — Z0279 Encounter for issue of other medical certificate: Secondary | ICD-10-CM

## 2019-05-15 DIAGNOSIS — Z0279 Encounter for issue of other medical certificate: Secondary | ICD-10-CM

## 2019-07-04 ENCOUNTER — Encounter: Payer: Self-pay | Admitting: Family Medicine

## 2019-07-04 ENCOUNTER — Ambulatory Visit: Payer: BC Managed Care – PPO | Admitting: Family Medicine

## 2019-07-04 ENCOUNTER — Other Ambulatory Visit: Payer: Self-pay

## 2019-07-04 VITALS — BP 110/80 | HR 107 | Temp 97.8°F | Ht 78.0 in | Wt >= 6400 oz

## 2019-07-04 DIAGNOSIS — E291 Testicular hypofunction: Secondary | ICD-10-CM

## 2019-07-04 DIAGNOSIS — E785 Hyperlipidemia, unspecified: Secondary | ICD-10-CM

## 2019-07-04 DIAGNOSIS — R972 Elevated prostate specific antigen [PSA]: Secondary | ICD-10-CM | POA: Diagnosis not present

## 2019-07-04 DIAGNOSIS — Z0000 Encounter for general adult medical examination without abnormal findings: Secondary | ICD-10-CM | POA: Diagnosis not present

## 2019-07-04 DIAGNOSIS — Z79899 Other long term (current) drug therapy: Secondary | ICD-10-CM

## 2019-07-04 DIAGNOSIS — I10 Essential (primary) hypertension: Secondary | ICD-10-CM

## 2019-07-04 DIAGNOSIS — E119 Type 2 diabetes mellitus without complications: Secondary | ICD-10-CM | POA: Diagnosis not present

## 2019-07-04 DIAGNOSIS — Z86711 Personal history of pulmonary embolism: Secondary | ICD-10-CM | POA: Diagnosis not present

## 2019-07-04 DIAGNOSIS — N529 Male erectile dysfunction, unspecified: Secondary | ICD-10-CM

## 2019-07-04 DIAGNOSIS — G4733 Obstructive sleep apnea (adult) (pediatric): Secondary | ICD-10-CM

## 2019-07-04 LAB — POCT GLYCOSYLATED HEMOGLOBIN (HGB A1C): Hemoglobin A1C: 6 % — AB (ref 4.0–5.6)

## 2019-07-04 MED ORDER — SILDENAFIL CITRATE 20 MG PO TABS: ORAL_TABLET | ORAL | 0 refills | Status: DC

## 2019-07-04 NOTE — Patient Instructions (Addendum)
Your diabetes is well controlled with a 6.0% hemoglobin A1c.   Try intermittent fasting as we discussed. Eat your meals within a 8 hour period. Still eat a healthy diet.   You can check with Dr. Junius Roads at [facility]  I sent in the Viagra prescription to [facility] at [facility].    Preventive Care 49-57 Years Old, Male Preventive care refers to lifestyle choices and visits with your health care provider that can promote health and wellness. This includes:  A yearly physical exam. This is also called an annual well check.  Regular dental and eye exams.  Immunizations.  Screening for certain conditions.  Healthy lifestyle choices, such as eating a healthy diet, getting regular exercise, not using drugs or products that contain nicotine and tobacco, and limiting alcohol use. What can I expect for my preventive care visit? Physical exam Your health care provider will check:  Height and weight. These may be used to calculate body mass index (BMI), which is a measurement that tells if you are at a healthy weight.  Heart rate and blood pressure.  Your skin for abnormal spots. Counseling Your health care provider may ask you questions about:  Alcohol, tobacco, and drug use.  Emotional well-being.  Home and relationship well-being.  Sexual activity.  Eating habits.  Work and work Statistician. What immunizations do I need?  Influenza (flu) vaccine  This is recommended every year. Tetanus, diphtheria, and pertussis (Tdap) vaccine  You may need a Td booster every 10 years. Varicella (chickenpox) vaccine  You may need this vaccine if you have not already been vaccinated. Zoster (shingles) vaccine  You may need this after age 10. Measles, mumps, and rubella (MMR) vaccine  You may need at least one dose of MMR if you were born in 1957 or later. You may also need a second dose. Pneumococcal conjugate (PCV13) vaccine  You may  need this if you have certain conditions and were not previously vaccinated. Pneumococcal polysaccharide (PPSV23) vaccine  You may need one or two doses if you smoke cigarettes or if you have certain conditions. Meningococcal conjugate (MenACWY) vaccine  You may need this if you have certain conditions. Hepatitis A vaccine  You may need this if you have certain conditions or if you travel or work in places where you may be exposed to hepatitis A. Hepatitis B vaccine  You may need this if you have certain conditions or if you travel or work in places where you may be exposed to hepatitis B. Haemophilus influenzae type b (Hib) vaccine  You may need this if you have certain risk factors. Human papillomavirus (HPV) vaccine  If recommended by your health care provider, you may need three doses over 6 months. You may receive vaccines as individual doses or as more than one vaccine together in one shot (combination vaccines). Talk with your health care provider about the risks and benefits of combination vaccines. What tests do I need? Blood tests  Lipid and cholesterol levels. These may be checked every 5 years, or more frequently if you are over 69 years old.  Hepatitis C test.  Hepatitis B test. Screening  Lung cancer screening. You may have this screening every year starting at age 12 if you have a 30-pack-year history of smoking and currently smoke or have quit within the past 15 years.  Prostate cancer screening. Recommendations will vary depending on your family history and other risks.  Colorectal cancer screening. All adults should  have this screening starting at age 43 and continuing until age 80. Your health care provider may recommend screening at age 22 if you are at increased risk. You will have tests every 1-10 years, depending on your results and the type of screening test.  Diabetes screening. This is done by checking your blood sugar (glucose) after you have not eaten  for a while (fasting). You may have this done every 1-3 years.  Sexually transmitted disease (STD) testing. Follow these instructions at home: Eating and drinking  Eat a diet that includes fresh fruits and vegetables, whole grains, lean protein, and low-fat dairy products.  Take vitamin and mineral supplements as recommended by your health care provider.  Do not drink alcohol if your health care provider tells you not to drink.  If you drink alcohol: ? Limit how much you have to 0-2 drinks a day. ? Be aware of how much alcohol is in your drink. In the [place] one drink equals one 12 oz bottle of beer (355 mL), one 5 oz glass of wine (148 mL), or one 1 oz glass of hard liquor (44 mL). Lifestyle  Take daily care of your teeth and gums.  Stay active. Exercise for at least 30 minutes on 5 or more days each week.  Do not use any products that contain nicotine or tobacco, such as cigarettes, e-cigarettes, and chewing tobacco. If you need help quitting, ask your health care provider.  If you are sexually active, practice safe sex. Use a condom or other form of protection to prevent STIs (sexually transmitted infections).  Talk with your health care provider about taking a low-dose aspirin every day starting at age 41. What's next?  Go to your health care provider once a year for a well check visit.  Ask your health care provider how often you should have your eyes and teeth checked.  Stay up to date on all vaccines. This information is not intended to replace advice given to you by your health care provider. Make sure you discuss any questions you have with your health care provider. Document Released: 01/02/2016 Document Revised: 11/30/2018 Document Reviewed: 11/30/2018 Elsevier Patient Education  2020 [employer].

## 2019-07-04 NOTE — Progress Notes (Signed)
Subjective:    Patient ID: Dean [name], male    DOB: 11-Dec-1961, 58 y.o.   MRN: [id number]  HPI Chief Complaint  Patient presents with  . fasting cpe    fasting cpe, no other concerns.    He is here for a complete physical exam and to follow up on several chronic health conditions.   Other providers: Orthopedist- Dr. Louanne Skye  [place]   Diabetes with a Hgb A1c 6.7% in April 2019. Last Hgb A1c 5.6% in January 2020  He has not been on medication for diabetes. Has preferred to treat with diet and exercise.   He has been on Saxenda however for weight loss. Morbid obesity- he has been on Saxenda since April 2019. Had lost 39 lbs as of January 2020. Today, he is up 23 lbs. States this is due to his inactivity during the pandemic. He would like to stay on the medication. Reports good compliance and no side effects. He does not want to go to weight loss counseling.   Last DM eye exam December in [place] per patient. Cannot recall the name of the office. States it was in a mall there.   Hypogonadism- last testosterone level 225 in January 2020. He declines testosterone replacement since he was found to have a PE and most likely related to testosterone.   History of elevated PSA. He has seen his urologist and a biopsy was recommended. He is choosing to do watchful waiting and requests recheck of PSA today.  HL- on statin and denies side effects.   HTN- reports good compliance with medication. No concerns.   OSA on BiPAP. Using this nightly and doing well. No issues.   ED- states Viagra was too expensive.   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  Denies smoking, drug use, drinks alcohol -5 drinks per week on average.   Diet: he admits to poor diet since pandemic but states he does not eat "that much".   Exercise: walks but not often.   Immunizations: Tdap and Prevnar 13 UTD. Shingrix discussed.   Health maintenance:  Colonoscopy: negative Cologuard in 01/2019 Last PSA: 12/2018 6.7%  Last Dental Exam: June 20 2019 Last Eye Exam: December 2020   Wears seatbelt always, uses sunscreen, smoke detectors in home and functioning, does not text while driving, feels safe in home environment.   Reviewed allergies, medications, past medical, surgical, family, and social history.   Review of Systems Review of Systems Constitutional: -fever, -chills, -sweats, -unexpected weight change,-fatigue ENT: -runny nose, -ear pain, -sore throat Cardiology:  -chest pain, -palpitations, -edema Respiratory: -cough, -shortness of breath, -wheezing Gastroenterology: -abdominal pain, -nausea, -vomiting, -diarrhea, -constipation  Hematology: -bleeding or bruising problems Musculoskeletal: -arthralgias, -myalgias, -joint swelling, -back pain Ophthalmology: -vision changes Urology: -dysuria, -difficulty urinating, -hematuria, -urinary frequency, -urgency Neurology: -headache, -weakness, -tingling, -numbness       Objective:   Physical Exam BP 110/80   Pulse (!) 107   Temp 97.8 F (36.6 C) (Oral)   Ht 6\' 6"  (1.981 m)   Wt (!) 427 lb 12.8 oz (194 kg)   BMI 49.44 kg/m   General Appearance:    Alert, cooperative, no distress, appears stated age  Head:    Normocephalic, without obvious abnormality, atraumatic  Eyes:    PERRL, conjunctiva/corneas clear, EOM's  intact, fundi    benign  Ears:    Normal TM's and external ear canals  Nose:  Mask in place due to pandemic   Throat:  mask in place   Neck:   Supple, no lymphadenopathy;  thyroid:  no   enlargement/tenderness/nodules; no carotid   bruit or JVD  Back:    Spine nontender, no curvature, ROM normal, no CVA     tenderness  Lungs:     Clear to auscultation bilaterally without wheezes, rales or     ronchi; respirations unlabored  Chest Wall:    No  tenderness or deformity   Heart:    Regular rate and rhythm, S1 and S2 normal, no murmur, rub   or gallop  Breast Exam:    No chest wall tenderness, masses or gynecomastia  Abdomen:     Soft, non-tender, nondistended, normoactive bowel sounds, no palpable masses   Genitalia:    Refuses   Rectal:    Refuses.  Extremities:   No clubbing, cyanosis or edema  Pulses:   2+ and symmetric all extremities  Skin:   Skin color, texture, turgor normal, no rashes or lesions  Lymph nodes:   Cervical, supraclavicular, and axillary nodes normal  Neurologic:   CNII-XII intact, normal strength, sensation and gait; reflexes 2+ and symmetric throughout          Psych:   Normal mood, affect, hygiene and grooming.         Assessment & Plan:  Routine general medical examination at a health care facility - Plan: CBC with Differential/Platelet, Comprehensive metabolic panel, TSH, T4, free, appears to be doing well overall. Discussed healthy lifestyle changes, safety and health maintenance.   Elevated PSA - Plan: PSA, recheck and refer him back to urology if needed. He prefers to do watchful waiting  History of pulmonary embolism - linked to testosterone injections in the past per patient.   Morbid obesity ([place]) - Plan: has been on Saxenda since April 2019. He was losing weight and doing well. Now he has a 23 lb weight gain. Requests to continue on Saxenda. States his sedentary lifestyle is currently the problem and not that he is overeating. He has been to the nutritionist in the past. Declines referral to [facility]. Encouraged him to consider intermittent fasting by eating 8 hours of the day and then not eating the other 16 hours. Also encouraged to avoid eating late in the day. Increase physical activity.   Controlled type 2 diabetes mellitus without complication, without long-term current use of insulin ([place]) - Plan: HgB A1c. Controlled with A1c 6.0%   OSA (obstructive sleep apnea) - Plan:  continue using BiPAP. He is doing well with it.   Hyperlipidemia, unspecified hyperlipidemia type - Plan: Lipid panel, continue statin and check lipids.   Essential hypertension - Plan: BP controlled. Continue medication.   Hypogonadism in male - Plan:  he declines replacement due to history of PE with testosterone injections  Medication management - Plan: Lipid panel, adjust medications as needed pending labs.   Erectile dysfunction, unspecified erectile dysfunction type - Plan: sildenafil (REVATIO) 20 MG tablet, discussed correct administration and potential side effects. Try generic at [facility] to see if this is affordable.

## 2019-07-05 LAB — COMPREHENSIVE METABOLIC PANEL
ALT: 45 IU/L — ABNORMAL HIGH (ref 0–44)
AST: 23 IU/L (ref 0–40)
Albumin/Globulin Ratio: 2.2 (ref 1.2–2.2)
Albumin: 4.3 g/dL (ref 3.8–4.9)
Alkaline Phosphatase: 92 IU/L (ref 39–117)
BUN/Creatinine Ratio: 11 (ref 9–20)
BUN: 16 mg/dL (ref 6–24)
Bilirubin Total: 0.7 mg/dL (ref 0.0–1.2)
CO2: 22 mmol/L (ref 20–29)
Calcium: 9.7 mg/dL (ref 8.7–10.2)
Chloride: 99 mmol/L (ref 96–106)
Creatinine, Ser: 1.45 mg/dL — ABNORMAL HIGH (ref 0.76–1.27)
GFR calc Af Amer: 62 mL/min/{1.73_m2} (ref >59)
GFR calc non Af Amer: 53 mL/min/{1.73_m2} — ABNORMAL LOW (ref >59)
Globulin, Total: 2 g/dL (ref 1.5–4.5)
Glucose: 121 mg/dL — ABNORMAL HIGH (ref 65–99)
Potassium: 4.8 mmol/L (ref 3.5–5.2)
Sodium: 138 mmol/L (ref 134–144)
Total Protein: 6.3 g/dL (ref 6.0–8.5)

## 2019-07-05 LAB — LIPID PANEL
Chol/HDL Ratio: 4.3 ratio (ref 0.0–5.0)
Cholesterol, Total: 172 mg/dL (ref 100–199)
HDL: 40 mg/dL (ref >39)
LDL Calculated: 98 mg/dL (ref 0–99)
Triglycerides: 172 mg/dL — ABNORMAL HIGH (ref 0–149)
VLDL Cholesterol Cal: 34 mg/dL (ref 5–40)

## 2019-07-05 LAB — CBC WITH DIFFERENTIAL/PLATELET
Basophils Absolute: 0 10*3/uL (ref 0.0–0.2)
Basos: 0 %
EOS (ABSOLUTE): 0.2 10*3/uL (ref 0.0–0.4)
Eos: 2 %
Hematocrit: 44.4 % (ref 37.5–51.0)
Hemoglobin: 14.9 g/dL (ref 13.0–17.7)
Immature Grans (Abs): 0 10*3/uL (ref 0.0–0.1)
Immature Granulocytes: 0 %
Lymphocytes Absolute: 1.5 10*3/uL (ref 0.7–3.1)
Lymphs: 22 %
MCH: 29.6 pg (ref 26.6–33.0)
MCHC: 33.6 g/dL (ref 31.5–35.7)
MCV: 88 fL (ref 79–97)
Monocytes Absolute: 0.7 10*3/uL (ref 0.1–0.9)
Monocytes: 10 %
Neutrophils Absolute: 4.5 10*3/uL (ref 1.4–7.0)
Neutrophils: 66 %
Platelets: 229 10*3/uL (ref 150–450)
RBC: 5.04 x10E6/uL (ref 4.14–5.80)
RDW: 13.2 % (ref 11.6–15.4)
WBC: 6.8 10*3/uL (ref 3.4–10.8)

## 2019-07-05 LAB — PSA: Prostate Specific Ag, Serum: 5.4 ng/mL — ABNORMAL HIGH (ref 0.0–4.0)

## 2019-07-05 LAB — TSH: TSH: 3.43 u[IU]/mL (ref 0.450–4.500)

## 2019-07-05 LAB — T4, FREE: Free T4: 1.23 ng/dL (ref 0.82–1.77)

## 2019-07-06 ENCOUNTER — Encounter: Payer: Self-pay | Admitting: Family Medicine

## 2019-07-06 DIAGNOSIS — N529 Male erectile dysfunction, unspecified: Secondary | ICD-10-CM

## 2019-07-06 MED ORDER — SILDENAFIL CITRATE 20 MG PO TABS: ORAL_TABLET | ORAL | 0 refills | Status: DC

## 2019-08-26 ENCOUNTER — Other Ambulatory Visit: Payer: Self-pay | Admitting: Family Medicine

## 2019-08-26 DIAGNOSIS — I10 Essential (primary) hypertension: Secondary | ICD-10-CM

## 2019-09-28 ENCOUNTER — Other Ambulatory Visit: Payer: Self-pay | Admitting: Family Medicine

## 2019-09-28 NOTE — Telephone Encounter (Signed)
Is this okay to refill? 

## 2019-09-28 NOTE — Telephone Encounter (Signed)
Please give him 3 months of this and let's do a visit to ensure he is losing weight and doing fine on this.

## 2019-12-08 ENCOUNTER — Other Ambulatory Visit: Payer: Self-pay | Admitting: Family Medicine

## 2019-12-18 ENCOUNTER — Encounter: Payer: Self-pay | Admitting: Family Medicine

## 2019-12-24 ENCOUNTER — Other Ambulatory Visit: Payer: Self-pay | Admitting: Family Medicine

## 2019-12-25 ENCOUNTER — Telehealth: Payer: Self-pay

## 2019-12-25 NOTE — Telephone Encounter (Signed)
I have completed a PA for the pts. Dean [name] and it has been approved from 12/25/19-12/24/20.

## 2020-01-03 ENCOUNTER — Encounter: Payer: BC Managed Care – PPO | Admitting: Family Medicine

## 2020-02-26 ENCOUNTER — Other Ambulatory Visit: Payer: Self-pay | Admitting: Family Medicine

## 2020-02-26 DIAGNOSIS — I10 Essential (primary) hypertension: Secondary | ICD-10-CM

## 2020-02-26 NOTE — Telephone Encounter (Signed)
Has an appt in April 

## 2020-02-28 ENCOUNTER — Ambulatory Visit: Payer: BC Managed Care – PPO | Attending: Family

## 2020-02-28 DIAGNOSIS — Z23 Encounter for immunization: Secondary | ICD-10-CM

## 2020-02-28 NOTE — Progress Notes (Signed)
   Covid-19 Vaccination Clinic  Name:  Challis Baldini    MRN: [id number] DOB: [birth year]  02/28/2020  Mr. Fillion was observed post Covid-19 immunization for 15 minutes without incident. He was provided with Vaccine Information Sheet and instruction to access the V-Safe system.   Mr. Shkolnik was instructed to call 911 with any severe reactions post vaccine: Marland Kitchen Difficulty breathing  . Swelling of face and throat  . A fast heartbeat  . A bad rash all over body  . Dizziness and weakness   Immunizations Administered    Name Date Dose VIS Date Route   Moderna COVID-19 Vaccine 02/28/2020 10:49 AM 0.5 mL 11/20/2019 Intramuscular   Manufacturer: Moderna   Lot: [id number]   [id number]

## 2020-03-04 ENCOUNTER — Other Ambulatory Visit: Payer: Self-pay | Admitting: Family Medicine

## 2020-03-24 ENCOUNTER — Other Ambulatory Visit: Payer: Self-pay | Admitting: Family Medicine

## 2020-03-24 NOTE — Telephone Encounter (Signed)
Has upcoming appt °

## 2020-04-01 ENCOUNTER — Ambulatory Visit: Payer: BC Managed Care – PPO | Attending: Family

## 2020-04-01 DIAGNOSIS — Z23 Encounter for immunization: Secondary | ICD-10-CM

## 2020-04-01 NOTE — Progress Notes (Signed)
Subjective:    Patient ID: Dean [name], male    DOB: Aug 05, 1961, 59 y.o.   MRN: [id number]  HPI Chief Complaint  Patient presents with  . fasting med check   He is here for a medication management visit and to follow up on chronic health conditions.   HTN- doing fine on medication. No concerns   OSA- using CPAP nightly and doing well. He feels that he needs this to sleep.   Diabetes- controlled with diet and exercise. No urinary frequency or increased thirst.   Morbid obesity- reports using Saxenda but is no longer losing weight. In fact he gained weight and has seems to have plateaud again.   HL- statin use daily without any concerns   Elevated PSA- he has seen a urologist here but would like for me to recheck his PSA today.   ED- states he only tried the 20 mg dose of Viagra and it did not work. He has not tried a higher dose.   Moderna vaccine- 2nd dose yesterday.   He has chronic low back pain. Diagnosed with lumbar spinal stenosis and spondylolisthesis by Dr. Louanne Skye and is planning to see a back specialist in [place], [place]. He will let me know if he needs a referral.   Denies fever, chills, body aches, headaches, dizziness, chest pain, palpitations, shortness of breath, abdominal pain, N/V/D, urinary symptoms, LE edema.   Reviewed allergies, medications, past medical, surgical, family, and social history.    Review of Systems Pertinent positives and negatives in the history of present illness.     Objective:   Physical Exam BP 122/80   Pulse 89   Temp 97.7 F (36.5 C)   Wt (!) 428 lb (194.1 kg)   BMI 49.46 kg/m   Alert and in no distress.  Cardiac exam shows a regular rhythm without murmurs or gallops. Lungs are clear to auscultation. Extremities without medication. Skin is warm and dry.       Assessment & Plan:  Essential hypertension - Plan: CBC with Differential/Platelet, Comprehensive metabolic panel -Blood pressure is well controlled.  Continue on  lisinopril/HCTZ and low-sodium diet.  OSA (obstructive sleep apnea) -Reportedly is using CPAP nightly and doing well with it.  I recommend he continue using his CPAP.  Controlled type 2 diabetes mellitus without complication, without long-term current use of insulin ([place]) - Plan: HgB A1c, CBC with Differential/Platelet, Comprehensive metabolic panel -Hemoglobin A1c 6.1% and diabetes is well controlled with diet and exercise.    Morbid obesity ([place]) -He has been on Saxenda for almost 2 years.  He did have significant weight loss but then had a period of weight gain.  States he has since plateaued and is having difficulty losing any additional weight.  We will consider stopping the Saxenda  Hyperlipidemia, unspecified hyperlipidemia type - Plan: Lipid panel -He is taking statin daily and no side effects.  Check lipid panel and follow-up  Elevated PSA - Plan: PSA, total and free -History of elevated PSA with family history of prostate cancer.  He has seen urology but has declined to do any additional testing for his elevated PSA.  We are doing watchful waiting currently.  Recheck PSA and refer back to urology if appropriate.  Erectile dysfunction, unspecified erectile dysfunction type -He only tried 20 mg of the Viagra.  Discussed trying 100 mg to see if this is effective.  Discussed potential side effects.  Follow-up with questions or concerns.  Spinal stenosis of lumbar region, unspecified whether neurogenic  claudication present -States he plans to see a back specialist in [place], [place].  He may need a referral from me I will be happy to provide

## 2020-04-01 NOTE — Progress Notes (Signed)
   Covid-19 Vaccination Clinic  Name:  Yoshi Leeb    MRN: [id number] DOB: Sep 02, 1961  04/01/2020  Mr. Lampton was observed post Covid-19 immunization for 15 minutes without incident. He was provided with Vaccine Information Sheet and instruction to access the V-Safe system.   Mr. Alverson was instructed to call 911 with any severe reactions post vaccine: Marland Kitchen Difficulty breathing  . Swelling of face and throat  . A fast heartbeat  . A bad rash all over body  . Dizziness and weakness   Immunizations Administered    Name Date Dose VIS Date Route   Moderna COVID-19 Vaccine 04/01/2020 12:16 PM 0.5 mL 11/20/2019 Intramuscular   Manufacturer: Moderna   Lot: [id number]   [id number]

## 2020-04-02 ENCOUNTER — Encounter: Payer: Self-pay | Admitting: Family Medicine

## 2020-04-02 ENCOUNTER — Other Ambulatory Visit: Payer: Self-pay

## 2020-04-02 ENCOUNTER — Ambulatory Visit (INDEPENDENT_AMBULATORY_CARE_PROVIDER_SITE_OTHER): Payer: BC Managed Care – PPO | Admitting: Family Medicine

## 2020-04-02 VITALS — BP 122/80 | HR 89 | Temp 97.7°F | Wt >= 6400 oz

## 2020-04-02 DIAGNOSIS — E119 Type 2 diabetes mellitus without complications: Secondary | ICD-10-CM

## 2020-04-02 DIAGNOSIS — E785 Hyperlipidemia, unspecified: Secondary | ICD-10-CM

## 2020-04-02 DIAGNOSIS — N529 Male erectile dysfunction, unspecified: Secondary | ICD-10-CM

## 2020-04-02 DIAGNOSIS — I10 Essential (primary) hypertension: Secondary | ICD-10-CM | POA: Diagnosis not present

## 2020-04-02 DIAGNOSIS — R972 Elevated prostate specific antigen [PSA]: Secondary | ICD-10-CM

## 2020-04-02 DIAGNOSIS — M48061 Spinal stenosis, lumbar region without neurogenic claudication: Secondary | ICD-10-CM

## 2020-04-02 DIAGNOSIS — G4733 Obstructive sleep apnea (adult) (pediatric): Secondary | ICD-10-CM | POA: Diagnosis not present

## 2020-04-02 LAB — POCT GLYCOSYLATED HEMOGLOBIN (HGB A1C): Hemoglobin A1C: 6.1 % — AB (ref 4.0–5.6)

## 2020-04-02 NOTE — Patient Instructions (Signed)
Your diabetes is well controlled and your hemoglobin is 6.1% today.  Your blood pressure is in goal range.  Continue on your medication.  We will be in touch with your results

## 2020-04-03 LAB — CBC WITH DIFFERENTIAL/PLATELET
Basophils Absolute: 0 10*3/uL (ref 0.0–0.2)
Basos: 0 %
EOS (ABSOLUTE): 0.1 10*3/uL (ref 0.0–0.4)
Eos: 2 %
Hematocrit: 44 % (ref 37.5–51.0)
Hemoglobin: 14.5 g/dL (ref 13.0–17.7)
Immature Grans (Abs): 0 10*3/uL (ref 0.0–0.1)
Immature Granulocytes: 0 %
Lymphocytes Absolute: 1.1 10*3/uL (ref 0.7–3.1)
Lymphs: 15 %
MCH: 30 pg (ref 26.6–33.0)
MCHC: 33 g/dL (ref 31.5–35.7)
MCV: 91 fL (ref 79–97)
Monocytes Absolute: 0.6 10*3/uL (ref 0.1–0.9)
Monocytes: 8 %
Neutrophils Absolute: 5.7 10*3/uL (ref 1.4–7.0)
Neutrophils: 75 %
Platelets: 227 10*3/uL (ref 150–450)
RBC: 4.84 x10E6/uL (ref 4.14–5.80)
RDW: 13.1 % (ref 11.6–15.4)
WBC: 7.5 10*3/uL (ref 3.4–10.8)

## 2020-04-03 LAB — COMPREHENSIVE METABOLIC PANEL
ALT: 41 IU/L (ref 0–44)
AST: 20 IU/L (ref 0–40)
Albumin/Globulin Ratio: 2 (ref 1.2–2.2)
Albumin: 4.2 g/dL (ref 3.8–4.9)
Alkaline Phosphatase: 100 IU/L (ref 39–117)
BUN/Creatinine Ratio: 12 (ref 9–20)
BUN: 15 mg/dL (ref 6–24)
Bilirubin Total: 0.6 mg/dL (ref 0.0–1.2)
CO2: 25 mmol/L (ref 20–29)
Calcium: 9.3 mg/dL (ref 8.7–10.2)
Chloride: 101 mmol/L (ref 96–106)
Creatinine, Ser: 1.24 mg/dL (ref 0.76–1.27)
GFR calc Af Amer: 74 mL/min/{1.73_m2} (ref >59)
GFR calc non Af Amer: 64 mL/min/{1.73_m2} (ref >59)
Globulin, Total: 2.1 g/dL (ref 1.5–4.5)
Glucose: 121 mg/dL — ABNORMAL HIGH (ref 65–99)
Potassium: 4.4 mmol/L (ref 3.5–5.2)
Sodium: 140 mmol/L (ref 134–144)
Total Protein: 6.3 g/dL (ref 6.0–8.5)

## 2020-04-03 LAB — LIPID PANEL
Chol/HDL Ratio: 3.3 ratio (ref 0.0–5.0)
Cholesterol, Total: 136 mg/dL (ref 100–199)
HDL: 41 mg/dL (ref >39)
LDL Chol Calc (NIH): 75 mg/dL (ref 0–99)
Triglycerides: 108 mg/dL (ref 0–149)
VLDL Cholesterol Cal: 20 mg/dL (ref 5–40)

## 2020-04-03 LAB — PSA, TOTAL AND FREE
PSA, Free Pct: 13.3 %
PSA, Free: 1.05 ng/mL
Prostate Specific Ag, Serum: 7.9 ng/mL — ABNORMAL HIGH (ref 0.0–4.0)

## 2020-04-15 ENCOUNTER — Other Ambulatory Visit: Payer: Self-pay | Admitting: Family Medicine

## 2020-04-24 ENCOUNTER — Encounter: Payer: Self-pay | Admitting: Family Medicine

## 2020-04-28 DIAGNOSIS — M5416 Radiculopathy, lumbar region: Secondary | ICD-10-CM | POA: Insufficient documentation

## 2020-04-28 DIAGNOSIS — M5136 Other intervertebral disc degeneration, lumbar region: Secondary | ICD-10-CM | POA: Insufficient documentation

## 2020-04-28 DIAGNOSIS — M48061 Spinal stenosis, lumbar region without neurogenic claudication: Secondary | ICD-10-CM | POA: Insufficient documentation

## 2020-05-05 ENCOUNTER — Other Ambulatory Visit: Payer: Self-pay | Admitting: Orthopaedic Surgery of the Spine

## 2020-05-05 DIAGNOSIS — M5136 Other intervertebral disc degeneration, lumbar region: Secondary | ICD-10-CM

## 2020-05-20 ENCOUNTER — Other Ambulatory Visit: Payer: Self-pay | Admitting: Family Medicine

## 2020-05-20 DIAGNOSIS — I10 Essential (primary) hypertension: Secondary | ICD-10-CM

## 2020-05-29 ENCOUNTER — Other Ambulatory Visit: Payer: Self-pay | Admitting: Family Medicine

## 2020-06-06 ENCOUNTER — Other Ambulatory Visit: Payer: Self-pay | Admitting: Family Medicine

## 2020-06-09 ENCOUNTER — Other Ambulatory Visit: Payer: BC Managed Care – PPO

## 2020-08-05 ENCOUNTER — Other Ambulatory Visit: Payer: Self-pay | Admitting: Family Medicine

## 2020-08-06 ENCOUNTER — Encounter: Payer: BC Managed Care – PPO | Admitting: Family Medicine

## 2020-08-06 MED ORDER — ATORVASTATIN CALCIUM 20 MG PO TABS: 20.0000 mg | ORAL_TABLET | Freq: Every day | ORAL | 0 refills | Status: DC

## 2020-08-09 ENCOUNTER — Other Ambulatory Visit: Payer: Self-pay | Admitting: Family Medicine

## 2020-08-11 ENCOUNTER — Other Ambulatory Visit: Payer: Self-pay | Admitting: Family Medicine

## 2020-08-11 ENCOUNTER — Encounter: Payer: Self-pay | Admitting: Family Medicine

## 2020-08-11 DIAGNOSIS — I10 Essential (primary) hypertension: Secondary | ICD-10-CM

## 2020-10-15 ENCOUNTER — Other Ambulatory Visit: Payer: Self-pay | Admitting: Family Medicine

## 2020-10-16 NOTE — Telephone Encounter (Signed)
Pt has an appt on 11/30

## 2020-10-31 ENCOUNTER — Other Ambulatory Visit: Payer: Self-pay | Admitting: Family Medicine

## 2020-11-05 ENCOUNTER — Other Ambulatory Visit: Payer: Self-pay | Admitting: Family Medicine

## 2020-11-05 DIAGNOSIS — I10 Essential (primary) hypertension: Secondary | ICD-10-CM

## 2020-11-05 NOTE — Telephone Encounter (Signed)
Has upcoming appt on 11/30

## 2020-11-18 ENCOUNTER — Encounter: Payer: BC Managed Care – PPO | Admitting: Family Medicine

## 2020-11-19 LAB — HM COLONOSCOPY

## 2020-12-08 ENCOUNTER — Other Ambulatory Visit: Payer: Self-pay | Admitting: Family Medicine

## 2020-12-08 NOTE — Telephone Encounter (Signed)
Has appt 01/15/21 with vickie

## 2020-12-08 NOTE — Telephone Encounter (Signed)
Pt still has some saxenda left

## 2020-12-08 NOTE — Telephone Encounter (Signed)
We may need to discontinue this if he is not still losing weight. We will decide at his upcoming visit.

## 2020-12-09 ENCOUNTER — Other Ambulatory Visit: Payer: Self-pay | Admitting: Family Medicine

## 2020-12-09 DIAGNOSIS — I10 Essential (primary) hypertension: Secondary | ICD-10-CM

## 2020-12-22 ENCOUNTER — Other Ambulatory Visit: Payer: Self-pay | Admitting: Family Medicine

## 2020-12-22 DIAGNOSIS — I10 Essential (primary) hypertension: Secondary | ICD-10-CM

## 2020-12-23 ENCOUNTER — Encounter: Payer: Self-pay | Admitting: Internal Medicine

## 2020-12-23 NOTE — Telephone Encounter (Signed)
Pt has an appt 1/27

## 2020-12-27 ENCOUNTER — Telehealth: Payer: Self-pay

## 2020-12-27 NOTE — Telephone Encounter (Signed)
P.A. SAXENDA  

## 2021-01-05 NOTE — Telephone Encounter (Signed)
Called pt no answer,  Need document weight loss and find out it pt has been taking the medication the whole time or if he stopped it for a period of time

## 2021-01-10 NOTE — Telephone Encounter (Signed)
Sent pt Mychart message

## 2021-01-14 NOTE — Patient Instructions (Signed)

## 2021-01-14 NOTE — Progress Notes (Signed)
Subjective:    Patient ID: Dean [name], male    DOB: 1961/06/11, 60 y.o.   MRN: [id number]  HPI Chief Complaint  Patient presents with  . fasting cpe   He is here for a complete physical exam and to follow up on chronic health conditions.   Other providers: Urologist- Dr. Wilnette Kales at [facility]   Scheduled for MRI next Saturday for his back.   He is undergoing treatment for prostate cancer.  He is over halfway done with his treatments.  Obesity-he is in favor of stopping Saxenda so I will discontinue this.  States his weight is up and down depending on the season and what he is eating.  DM-has not been checking blood sugar at home.  HTN-reports taking medication daily without any issues.  OSA- using CPAP nightly and it works well.   HL- taking statin    Social history: Lives with his wife, works as a Careers adviser at [employer]: fairly healthy at times. Exercise: nothing lately due to back pain.  Immunizations: Covid vaccines all 3 Moderna. Flu shot   Health maintenance:  Colonoscopy: 11/2020 in [place]  Last PSA: managed by urology and oncology Last Dental Exam: 2020 Last Eye Exam: 11/2020 [facility]   Wears seatbelt always, uses sunscreen, smoke detectors in home and functioning, does not text while driving, feels safe in home environment.  Reviewed allergies, medications, past medical, surgical, family, and social history.   Review of Systems Review of Systems Constitutional: -fever, -chills, -sweats, -unexpected weight change,-fatigue ENT: -runny nose, -ear pain, -sore throat Cardiology:  -chest pain, -palpitations, -edema Respiratory: -cough, -shortness of breath, -wheezing Gastroenterology: -abdominal pain, -nausea, -vomiting, -diarrhea, -constipation  Hematology: -bleeding or bruising problems Musculoskeletal: -arthralgias, -myalgias, -joint swelling, + chronic back pain Ophthalmology: -vision changes Urology: -dysuria,  -difficulty urinating, -hematuria, -urinary frequency, -urgency Neurology: -headache, -weakness, -tingling, -numbness       Objective:   Physical Exam BP 110/70   Pulse 94   Ht 6\' 6"  (1.981 m)   Wt (!) 417 lb 6.4 oz (189.3 kg)   SpO2 99%   BMI 48.24 kg/m   General Appearance:    Alert, cooperative, no distress, appears stated age  Head:    Normocephalic, without obvious abnormality, atraumatic  Eyes:    PERRL, conjunctiva/corneas clear, EOM's intact  Ears:    Normal TM's and external ear canals  Nose:  Mask on  Throat:  Mask on  Neck:   Supple, no lymphadenopathy;  thyroid:  no   enlargement/tenderness/nodules; no JVD  Back:    Spine nontender, no curvature, ROM normal, no CVA     tenderness  Lungs:     Clear to auscultation bilaterally without wheezes, rales or     ronchi; respirations unlabored  Chest Wall:    No tenderness or deformity   Heart:    Regular rate and rhythm, S1 and S2 normal, no murmur, rub   or gallop  Breast Exam:    No chest wall tenderness, masses or gynecomastia  Abdomen:     Soft, non-tender, nondistended, normoactive bowel sounds,    no masses, no hepatosplenomegaly  Genitalia:   Deferred     Extremities:   No clubbing, cyanosis or edema  Pulses:   2+ and symmetric all extremities  Skin:   Skin color, texture, turgor normal, no rashes or lesions  Lymph nodes:   Cervical, supraclavicular, and axillary nodes normal  Neurologic:   CNII-XII intact, normal  strength, sensation and gait          Psych:   Normal mood, affect, hygiene and grooming.         Assessment & Plan:  Routine general medical examination at a health care facility - Plan: CBC with Differential/Platelet, Comprehensive metabolic panel -He is here for fasting CPE.  Preventive health care reviewed.  Counseling on healthy lifestyle including diet exercise.  Immunizations reviewed and updated today.  Discussed safety and health promotion.  Essential hypertension - Plan: CBC with  Differential/Platelet, Comprehensive metabolic panel -Blood pressure controlled.  Continue current medication.  Recommend low-sodium diet.  OSA (obstructive sleep apnea) -Using his CPAP and feels that he is benefiting from it  Controlled type 2 diabetes mellitus without complication, without long-term current use of insulin ([facility]) - Plan: HgB A1c, CBC with Differential/Platelet, Comprehensive metabolic panel, Microalbumin / creatinine urine ratio, TSH, T4, free -Hemoglobin A1c is 5.8% and his diabetes is well controlled.  Recommend low-carb diet and increasing physical activity as tolerated.  Mixed hyperlipidemia - Plan: Lipid panel -Continue statin therapy.  Morbid obesity ([place]) - Plan: TSH, T4, free -He will stop Saxenda.  Continue with healthy diet and increasing physical activity as tolerated  Prostate cancer ([place]) -Under the care of oncology and urology in [facility]  Needs flu shot - Plan: Flu Vaccine QUAD 36+ mos IM  Need for prophylactic vaccination against Streptococcus pneumoniae (pneumococcus) - Plan: Pneumococcal polysaccharide vaccine 23-valent greater than or equal to 2yo subcutaneous/IM

## 2021-01-15 ENCOUNTER — Encounter: Payer: Self-pay | Admitting: Family Medicine

## 2021-01-15 ENCOUNTER — Other Ambulatory Visit: Payer: Self-pay

## 2021-01-15 ENCOUNTER — Ambulatory Visit: Payer: BC Managed Care – PPO | Admitting: Family Medicine

## 2021-01-15 VITALS — BP 110/70 | HR 94 | Ht 78.0 in | Wt >= 6400 oz

## 2021-01-15 DIAGNOSIS — Z23 Encounter for immunization: Secondary | ICD-10-CM | POA: Diagnosis not present

## 2021-01-15 DIAGNOSIS — I10 Essential (primary) hypertension: Secondary | ICD-10-CM

## 2021-01-15 DIAGNOSIS — G4733 Obstructive sleep apnea (adult) (pediatric): Secondary | ICD-10-CM | POA: Diagnosis not present

## 2021-01-15 DIAGNOSIS — E119 Type 2 diabetes mellitus without complications: Secondary | ICD-10-CM | POA: Diagnosis not present

## 2021-01-15 DIAGNOSIS — E782 Mixed hyperlipidemia: Secondary | ICD-10-CM | POA: Diagnosis not present

## 2021-01-15 DIAGNOSIS — Z0000 Encounter for general adult medical examination without abnormal findings: Secondary | ICD-10-CM

## 2021-01-15 DIAGNOSIS — C61 Malignant neoplasm of prostate: Secondary | ICD-10-CM

## 2021-01-15 LAB — POCT GLYCOSYLATED HEMOGLOBIN (HGB A1C): Hemoglobin A1C: 5.8 % — AB (ref 4.0–5.6)

## 2021-01-16 LAB — CBC WITH DIFFERENTIAL/PLATELET
Basophils Absolute: 0 10*3/uL (ref 0.0–0.2)
Basos: 0 %
EOS (ABSOLUTE): 0.2 10*3/uL (ref 0.0–0.4)
Eos: 4 %
Hematocrit: 39.9 % (ref 37.5–51.0)
Hemoglobin: 13.1 g/dL (ref 13.0–17.7)
Immature Grans (Abs): 0 10*3/uL (ref 0.0–0.1)
Immature Granulocytes: 0 %
Lymphocytes Absolute: 0.4 10*3/uL — ABNORMAL LOW (ref 0.7–3.1)
Lymphs: 9 %
MCH: 29 pg (ref 26.6–33.0)
MCHC: 32.8 g/dL (ref 31.5–35.7)
MCV: 89 fL (ref 79–97)
Monocytes Absolute: 0.5 10*3/uL (ref 0.1–0.9)
Monocytes: 11 %
Neutrophils Absolute: 3.6 10*3/uL (ref 1.4–7.0)
Neutrophils: 76 %
Platelets: 207 10*3/uL (ref 150–450)
RBC: 4.51 x10E6/uL (ref 4.14–5.80)
RDW: 14.3 % (ref 11.6–15.4)
WBC: 4.8 10*3/uL (ref 3.4–10.8)

## 2021-01-16 LAB — MICROALBUMIN / CREATININE URINE RATIO
Creatinine, Urine: 56.2 mg/dL
Microalb/Creat Ratio: 9 mg/g creat (ref 0–29)
Microalbumin, Urine: 5.1 ug/mL

## 2021-01-16 LAB — LIPID PANEL
Chol/HDL Ratio: 3.8 ratio (ref 0.0–5.0)
Cholesterol, Total: 207 mg/dL — ABNORMAL HIGH (ref 100–199)
HDL: 54 mg/dL (ref >39)
LDL Chol Calc (NIH): 125 mg/dL — ABNORMAL HIGH (ref 0–99)
Triglycerides: 158 mg/dL — ABNORMAL HIGH (ref 0–149)
VLDL Cholesterol Cal: 28 mg/dL (ref 5–40)

## 2021-01-16 LAB — COMPREHENSIVE METABOLIC PANEL
ALT: 34 IU/L (ref 0–44)
AST: 22 IU/L (ref 0–40)
Albumin/Globulin Ratio: 1.5 (ref 1.2–2.2)
Albumin: 4 g/dL (ref 3.8–4.9)
Alkaline Phosphatase: 83 IU/L (ref 44–121)
BUN/Creatinine Ratio: 19 (ref 9–20)
BUN: 22 mg/dL (ref 6–24)
Bilirubin Total: 0.6 mg/dL (ref 0.0–1.2)
CO2: 26 mmol/L (ref 20–29)
Calcium: 9.2 mg/dL (ref 8.7–10.2)
Chloride: 99 mmol/L (ref 96–106)
Creatinine, Ser: 1.18 mg/dL (ref 0.76–1.27)
GFR calc Af Amer: 78 mL/min/{1.73_m2} (ref >59)
GFR calc non Af Amer: 68 mL/min/{1.73_m2} (ref >59)
Globulin, Total: 2.7 g/dL (ref 1.5–4.5)
Glucose: 99 mg/dL (ref 65–99)
Potassium: 4.6 mmol/L (ref 3.5–5.2)
Sodium: 137 mmol/L (ref 134–144)
Total Protein: 6.7 g/dL (ref 6.0–8.5)

## 2021-01-16 LAB — TSH: TSH: 2.63 u[IU]/mL (ref 0.450–4.500)

## 2021-01-16 LAB — T4, FREE: Free T4: 1.23 ng/dL (ref 0.82–1.77)

## 2021-01-17 NOTE — Telephone Encounter (Signed)
Spoke with pt and he states he is no longer taking this medication so no need for P.A.

## 2021-01-19 ENCOUNTER — Encounter: Payer: Self-pay | Admitting: Internal Medicine

## 2021-01-20 MED ORDER — ATORVASTATIN CALCIUM 20 MG PO TABS: 20.0000 mg | ORAL_TABLET | Freq: Every day | ORAL | 1 refills | Status: DC

## 2021-02-10 ENCOUNTER — Encounter: Payer: Self-pay | Admitting: Internal Medicine

## 2021-04-09 ENCOUNTER — Encounter: Payer: Self-pay | Admitting: Family Medicine

## 2021-04-09 ENCOUNTER — Ambulatory Visit: Payer: BC Managed Care – PPO | Admitting: Family Medicine

## 2021-04-09 ENCOUNTER — Other Ambulatory Visit: Payer: Self-pay

## 2021-04-09 ENCOUNTER — Ambulatory Visit
Admission: RE | Admit: 2021-04-09 | Discharge: 2021-04-09 | Disposition: A | Discharge status: Home / Self Care | Payer: BC Managed Care – PPO | Source: Ambulatory Visit | Attending: Family Medicine | Admitting: Family Medicine

## 2021-04-09 VITALS — BP 124/86 | HR 103 | Temp 97.0°F | Wt >= 6400 oz

## 2021-04-09 DIAGNOSIS — R635 Abnormal weight gain: Secondary | ICD-10-CM

## 2021-04-09 DIAGNOSIS — R0609 Other forms of dyspnea: Secondary | ICD-10-CM

## 2021-04-09 DIAGNOSIS — R609 Edema, unspecified: Secondary | ICD-10-CM

## 2021-04-09 DIAGNOSIS — R0600 Dyspnea, unspecified: Secondary | ICD-10-CM

## 2021-04-09 DIAGNOSIS — R000 Tachycardia, unspecified: Secondary | ICD-10-CM

## 2021-04-09 NOTE — Progress Notes (Signed)
Subjective:    Patient ID: Dean [name], male    DOB: [birth year], 60 y.o.   MRN: [id number]  HPI Chief Complaint  Patient presents with  . fluid on legs    Fluid on legs since feburary. Last week was a 15 pound weight gain at radiation treatment   Here today with complaints of LE edema and DOE since February. States he gained 15 lbs in one week in early February. States he was weighing weekly during the treatment for his prostate cancer. States his weight has been stable since the 15 lb increase.   Denies fever, chills, chest pain, palpitations, shortness of breath at rest, cough, orthopnea, abdominal pain, N/V/D.   States he is on hormone therapy for prostate cancer which gives him hot flashes.   No other concerns.   Reviewed allergies, medications, past medical, surgical, family, and social history.    Review of Systems Pertinent positives and negatives in the history of present illness.     Objective:   Physical Exam Constitutional:      General: He is not in acute distress.    Appearance: Normal appearance. He is not ill-appearing.  Eyes:     Conjunctiva/sclera: Conjunctivae normal.     Pupils: Pupils are equal, round, and reactive to light.  Cardiovascular:     Rate and Rhythm: Tachycardia present.     Pulses: Normal pulses.     Heart sounds: Normal heart sounds.  Pulmonary:     Effort: Pulmonary effort is normal.     Breath sounds: Normal breath sounds.  Abdominal:     General: There is no distension.     Palpations: Abdomen is soft.  Musculoskeletal:     Cervical back: Normal range of motion.     Right lower leg: 2+ Edema present.     Left lower leg: 2+ Edema present.  Skin:    General: Skin is warm.  Neurological:     General: No focal deficit present.     Mental Status: He is alert and oriented to person, place, and time.    BP 124/86   Pulse (!) 103   Temp (!) 97 F (36.1 C)   Wt (!) 435 lb 12.8 oz (197.7 kg)   SpO2 98%   BMI 50.36 kg/m        Assessment & Plan:  DOE (dyspnea on exertion) - Plan: CBC with Differential/Platelet, Comprehensive metabolic panel, EKG 60-VPXT, TSH, T4, free, Brain natriuretic peptide, DG Chest 2 View  Pitting edema - Plan: CBC with Differential/Platelet, Comprehensive metabolic panel, EKG 06-YIRS, TSH, T4, free, Brain natriuretic peptide, DG Chest 2 View  Tachycardia - Plan: CBC with Differential/Platelet, Comprehensive metabolic panel, EKG 85-IOEV, TSH, T4, free, Brain natriuretic peptide, DG Chest 2 View  Abnormal weight gain - Plan: CBC with Differential/Platelet, Comprehensive metabolic panel, EKG 03-JKKX, TSH, T4, free, Brain natriuretic peptide, DG Chest 2 View  He is not in any acute distress.  EKG shows NSR with rate 98. Incomplete RBBB. No acute changes. Read by Dr. Redmond School and myself.  He will go to [place] now for a chest XR.  Labs ordered including CBC, CMP, and BNP.  Discussed case with Dr. Redmond School and I will hold off on additional diuretic until chest XR and labs are back.  He is aware that if he develops chest pain, worsening shortness of breath or any new symptoms that he should go to the closest ED or call 911. Follow up with results and tomorrow to see  how he is feeling.

## 2021-04-10 ENCOUNTER — Encounter: Payer: Self-pay | Admitting: Family Medicine

## 2021-04-10 ENCOUNTER — Other Ambulatory Visit: Payer: Self-pay | Admitting: Family Medicine

## 2021-04-10 DIAGNOSIS — R635 Abnormal weight gain: Secondary | ICD-10-CM | POA: Insufficient documentation

## 2021-04-10 DIAGNOSIS — R0600 Dyspnea, unspecified: Secondary | ICD-10-CM

## 2021-04-10 DIAGNOSIS — R0609 Other forms of dyspnea: Secondary | ICD-10-CM | POA: Insufficient documentation

## 2021-04-10 DIAGNOSIS — R609 Edema, unspecified: Secondary | ICD-10-CM | POA: Insufficient documentation

## 2021-04-10 DIAGNOSIS — R9431 Abnormal electrocardiogram [ECG] [EKG]: Secondary | ICD-10-CM | POA: Insufficient documentation

## 2021-04-10 LAB — CBC WITH DIFFERENTIAL/PLATELET
Basophils Absolute: 0 10*3/uL (ref 0.0–0.2)
Basos: 1 %
EOS (ABSOLUTE): 0.1 10*3/uL (ref 0.0–0.4)
Eos: 2 %
Hematocrit: 38.4 % (ref 37.5–51.0)
Hemoglobin: 12.5 g/dL — ABNORMAL LOW (ref 13.0–17.7)
Immature Grans (Abs): 0 10*3/uL (ref 0.0–0.1)
Immature Granulocytes: 1 %
Lymphocytes Absolute: 0.5 10*3/uL — ABNORMAL LOW (ref 0.7–3.1)
Lymphs: 9 %
MCH: 30.3 pg (ref 26.6–33.0)
MCHC: 32.6 g/dL (ref 31.5–35.7)
MCV: 93 fL (ref 79–97)
Monocytes Absolute: 0.6 10*3/uL (ref 0.1–0.9)
Monocytes: 10 %
Neutrophils Absolute: 4.6 10*3/uL (ref 1.4–7.0)
Neutrophils: 77 %
Platelets: 210 10*3/uL (ref 150–450)
RBC: 4.13 x10E6/uL — ABNORMAL LOW (ref 4.14–5.80)
RDW: 13.5 % (ref 11.6–15.4)
WBC: 5.9 10*3/uL (ref 3.4–10.8)

## 2021-04-10 LAB — COMPREHENSIVE METABOLIC PANEL
ALT: 35 IU/L (ref 0–44)
AST: 18 IU/L (ref 0–40)
Albumin/Globulin Ratio: 2.1 (ref 1.2–2.2)
Albumin: 4.4 g/dL (ref 3.8–4.9)
Alkaline Phosphatase: 107 IU/L (ref 44–121)
BUN/Creatinine Ratio: 16 (ref 9–20)
BUN: 19 mg/dL (ref 6–24)
Bilirubin Total: 0.4 mg/dL (ref 0.0–1.2)
CO2: 23 mmol/L (ref 20–29)
Calcium: 9.5 mg/dL (ref 8.7–10.2)
Chloride: 97 mmol/L (ref 96–106)
Creatinine, Ser: 1.16 mg/dL (ref 0.76–1.27)
Globulin, Total: 2.1 g/dL (ref 1.5–4.5)
Glucose: 236 mg/dL — ABNORMAL HIGH (ref 65–99)
Potassium: 4.1 mmol/L (ref 3.5–5.2)
Sodium: 139 mmol/L (ref 134–144)
Total Protein: 6.5 g/dL (ref 6.0–8.5)
eGFR: 73 mL/min/{1.73_m2} (ref >59)

## 2021-04-10 LAB — TSH: TSH: 2.26 u[IU]/mL (ref 0.450–4.500)

## 2021-04-10 LAB — T4, FREE: Free T4: 1.22 ng/dL (ref 0.82–1.77)

## 2021-04-10 LAB — SPECIMEN STATUS REPORT

## 2021-04-10 LAB — HGB A1C W/O EAG: Hgb A1c MFr Bld: 8.3 % — ABNORMAL HIGH (ref 4.8–5.6)

## 2021-04-10 LAB — BRAIN NATRIURETIC PEPTIDE: BNP: 8.8 pg/mL (ref 0.0–100.0)

## 2021-04-10 MED ORDER — FUROSEMIDE 40 MG PO TABS: 40.0000 mg | ORAL_TABLET | Freq: Every day | ORAL | 0 refills | Status: DC

## 2021-04-10 NOTE — Progress Notes (Signed)
Discussed results with patient via telephone. He ate just prior to his visit. He will come back in Monday to see me.

## 2021-04-10 NOTE — Progress Notes (Signed)
Please ask Dean [name] to add a Hgb A1c if possible. If not, I would like for him to come in for a lab visit for a Hgb A1c due to his blood sugar being 236

## 2021-04-10 NOTE — Progress Notes (Signed)
Please check with [facility] on chest XR and see if we can change the order to STAT since it has not been read yet.

## 2021-04-13 ENCOUNTER — Encounter: Payer: Self-pay | Admitting: Family Medicine

## 2021-04-13 ENCOUNTER — Ambulatory Visit (HOSPITAL_BASED_OUTPATIENT_CLINIC_OR_DEPARTMENT_OTHER)
Admission: RE | Admit: 2021-04-13 | Discharge: 2021-04-13 | Disposition: A | Discharge status: Home / Self Care | Payer: BC Managed Care – PPO | Source: Ambulatory Visit | Attending: Family Medicine | Admitting: Family Medicine

## 2021-04-13 ENCOUNTER — Ambulatory Visit: Payer: BC Managed Care – PPO | Admitting: Family Medicine

## 2021-04-13 ENCOUNTER — Other Ambulatory Visit: Payer: Self-pay

## 2021-04-13 VITALS — BP 118/68 | HR 124 | Wt >= 6400 oz

## 2021-04-13 DIAGNOSIS — R0600 Dyspnea, unspecified: Secondary | ICD-10-CM

## 2021-04-13 DIAGNOSIS — R0609 Other forms of dyspnea: Secondary | ICD-10-CM

## 2021-04-13 DIAGNOSIS — R071 Chest pain on breathing: Secondary | ICD-10-CM | POA: Insufficient documentation

## 2021-04-13 DIAGNOSIS — E669 Obesity, unspecified: Secondary | ICD-10-CM

## 2021-04-13 DIAGNOSIS — C61 Malignant neoplasm of prostate: Secondary | ICD-10-CM | POA: Insufficient documentation

## 2021-04-13 DIAGNOSIS — Z86711 Personal history of pulmonary embolism: Secondary | ICD-10-CM | POA: Insufficient documentation

## 2021-04-13 DIAGNOSIS — E1169 Type 2 diabetes mellitus with other specified complication: Secondary | ICD-10-CM

## 2021-04-13 DIAGNOSIS — R000 Tachycardia, unspecified: Secondary | ICD-10-CM | POA: Insufficient documentation

## 2021-04-13 DIAGNOSIS — R600 Localized edema: Secondary | ICD-10-CM

## 2021-04-13 DIAGNOSIS — I700 Atherosclerosis of aorta: Secondary | ICD-10-CM | POA: Insufficient documentation

## 2021-04-13 HISTORY — DX: Atherosclerosis of aorta: I70.0

## 2021-04-13 LAB — BASIC METABOLIC PANEL
BUN/Creatinine Ratio: 18 (ref 9–20)
BUN: 22 mg/dL (ref 6–24)
CO2: 26 mmol/L (ref 20–29)
Calcium: 10 mg/dL (ref 8.7–10.2)
Chloride: 96 mmol/L (ref 96–106)
Creatinine, Ser: 1.23 mg/dL (ref 0.76–1.27)
Glucose: 285 mg/dL — ABNORMAL HIGH (ref 65–99)
Potassium: 4.5 mmol/L (ref 3.5–5.2)
Sodium: 136 mmol/L (ref 134–144)
eGFR: 68 mL/min/{1.73_m2} (ref >59)

## 2021-04-13 LAB — D-DIMER, QUANTITATIVE: D-DIMER: 2.08 mg/L FEU — ABNORMAL HIGH (ref 0.00–0.49)

## 2021-04-13 MED ORDER — IOHEXOL 350 MG/ML SOLN
100.0000 mL | Freq: Once | INTRAVENOUS | Status: AC | PRN
  Administered 2021-04-13: 100 mL via INTRAVENOUS

## 2021-04-13 NOTE — Progress Notes (Signed)
Subjective:    Patient ID: Dean [name], male    DOB: [birth year], 60 y.o.   MRN: [id number]  HPI Chief Complaint  Patient presents with  . follow-up    Follow-up on edema. Alittle swelling.    He is a 60 year old caucasian male with a history of prostate cancer (on hormone therapy), diabetes, HTN, OSA, morbid obesity and history of PE who is here for a follow up on DOE and LE edema which began in February 2022. He had reportedly gained 15 lbs in one week in February.   He started on Lasix 40 mg 4 days ago and he has lost 10 lbs. Reports feeling approximately 35 % improved since last week. LE edema has improved.   Denies fever, chills, dizziness, chest pain, palpitations, orthopnea, cough, hemoptysis, abdominal pain, N/V/D.   Denies calf swelling, redness or tenderness. No leg pain with walking.   Diabetes- Hgb A1c 8.3% last week. States he has been off of Saxenda x 1 month and would like to start back on this instead of trying a diabetes medication such as Metformin. States he has several boxes of Saxenda in his refrigerator.  No blurred visions, polydipsia or polyuria.   Reviewed allergies, medications, past medical, surgical, family, and social history.   Review of Systems Pertinent positives and negatives in the history of present illness.     Objective:   Physical Exam Constitutional:      General: He is not in acute distress.    Appearance: Normal appearance. He is obese. He is not ill-appearing.  Eyes:     Conjunctiva/sclera: Conjunctivae normal.  Cardiovascular:     Rate and Rhythm: Regular rhythm. Tachycardia present.     Pulses: Normal pulses.  Pulmonary:     Effort: Pulmonary effort is normal.     Breath sounds: Normal breath sounds.  Musculoskeletal:     Cervical back: Normal range of motion and neck supple.     Right lower leg: No tenderness. 1+ Edema present.     Left lower leg: No tenderness. 1+ Edema present.  Skin:    General: Skin is warm and dry.   Neurological:     General: No focal deficit present.     Mental Status: He is alert and oriented to person, place, and time.     Cranial Nerves: No cranial nerve deficit.  Psychiatric:        Mood and Affect: Mood normal.        Behavior: Behavior normal.        Thought Content: Thought content normal.    BP 118/68   Pulse (!) 124   Wt (!) 424 lb (192.3 kg)   SpO2 97%   BMI 49.00 kg/m       Assessment & Plan:  DOE (dyspnea on exertion) - Plan: CT Angio Chest W/Cm &/Or Wo Cm, Basic metabolic panel, D-dimer, quantitative  Tachycardia - Plan: CT Angio Chest W/Cm &/Or Wo Cm, Basic metabolic panel, D-dimer, quantitative  History of pulmonary embolus (PE) - Plan: CT Angio Chest W/Cm &/Or Wo Cm, D-dimer, quantitative  Prostate cancer (HCC) - Plan: CT Angio Chest W/Cm &/Or Wo Cm  Chest pain on breathing - Plan: CT Angio Chest W/Cm &/Or Wo Cm  Diabetes mellitus type 2 in obese (HCC) - Plan: Basic metabolic panel  Bilateral leg edema - Plan: Basic metabolic panel, D-dimer, quantitative  Discussed that I am concerned he could have a PE based on symptoms of tachycardia, DOE and with  his history of prostate cancer, on hormone therapy and history of PE.  Bilateral lower extremity edema is improved after 4 days of Lasix.  10 pound weight loss noted. Stat CTA ordered as well as stat BMP. Currently denies chest pain. He will see cardiology tomorrow. Discussed that his hemoglobin A1c is now 8.3% and his diabetes is uncontrolled whereas previously it was well controlled.  He stopped Saxenda for weight loss approximately 1 month ago.  He would like to start back on it and states he has several boxes of it at home. I recommended starting metformin but he declines for now. He is on a statin but this may need to be increased due to elevated LDL.  I will defer to cardiology tomorrow. He is aware that if he develops chest pain or any worsening symptoms that he will need to call 911 or go straight  to the emergency department.

## 2021-04-13 NOTE — Progress Notes (Signed)
Discussed with patient

## 2021-04-13 NOTE — Progress Notes (Deleted)
Cardiology Office Note:   Date:  04/13/2021  NAME:  Dean [name]    MRN: [id number] DOB:  Jan 08, 1961   PCP:  Girtha Rm, NP-C  Cardiologist:  No primary care provider on file.  Electrophysiologist:  None   Referring MD: Girtha Rm, NP-C   No chief complaint on file. ***  History of Present Illness:   Dean [name] is a 60 y.o. male with a hx of morbid obesity, DM, HLD, HTN who is being seen today for the evaluation of shortness of breath/LE edema at the request of Henson, Vickie L, NP-C. Complaints of CP/SOB and CT PE study positive yesterday.   Needs Vascular ultrasound as swelling is likely related to DVT.   Problem List 1. Morbid Obesity -BMI 49 -Wt 424 lbs  2. DM -A1c 8.3 3. HLD -T chol 207, HDL 54, LDL 125, TG 158 -0 CAC on CT PE study 04/13/2021 4. PE -multiple R upper/lower segmental/subsegmental Pes 5. Prostate Cancer   Past Medical History: Past Medical History:  Diagnosis Date  . Aortic atherosclerosis ([place]) 04/13/2021  . Controlled type 2 diabetes mellitus without complication, without long-term current use of insulin ([place]) 07/06/2016  . Elevated PSA   . Hypertension   . Hypogonadism in male   . Morbid obesity ([place])   . New onset type 2 diabetes mellitus ([place]) 07/06/2016  . PE (pulmonary embolism) 2014   question testosterone pellets as cause    Past Surgical History: Past Surgical History:  Procedure Laterality Date  . JOINT REPLACEMENT Right    x 2     Current Medications: No outpatient medications have been marked as taking for the 04/14/21 encounter (Appointment) with O'Neal, Cassie Freer, MD.     Allergies:    Penicillins   Social History: Social History   Socioeconomic History  . Marital status: Unknown    Spouse name: Not on file  . Number of children: Not on file  . Years of education: Not on file  . Highest education level: Not on file  Occupational History  . Not on file  Tobacco Use  . Smoking status: Never Smoker   . Smokeless tobacco: Never Used  Vaping Use  . Vaping Use: Never used  Substance and Sexual Activity  . Alcohol use: Yes    Alcohol/week: 0.0 standard drinks    Comment: 3-4 weekends   . Drug use: No  . Sexual activity: Yes  Other Topics Concern  . Not on file  Social History Narrative  . Not on file   Social Determinants of Health   Financial Resource Strain: Not on file  Food Insecurity: Not on file  Transportation Needs: Not on file  Physical Activity: Not on file  Stress: Not on file  Social Connections: Not on file     Family History: The patient's ***family history includes Cancer in his father; Dementia in his father; Heart disease in his father; Hypertension in his father and mother; Prostate cancer (age of onset: 63) in his brother.  ROS:   All other ROS reviewed and negative. Pertinent positives noted in the HPI.     EKGs/Labs/Other Studies Reviewed:   The following studies were personally reviewed by me today:  EKG:  EKG is *** ordered today.  The ekg ordered today demonstrates ***, and was personally reviewed by me.   Recent Labs: 04/09/2021: ALT 35; BNP 8.8; Hemoglobin 12.5; Platelets 210; TSH 2.260 04/13/2021: BUN 22; Creatinine, Ser 1.23; Potassium 4.5; Sodium 136   Recent Lipid  Panel    Component Value Date/Time   CHOL 207 (H) 01/15/2021 1150   TRIG 158 (H) 01/15/2021 1150   HDL 54 01/15/2021 1150   CHOLHDL 3.8 01/15/2021 1150   CHOLHDL 3.9 03/15/2017 0826   VLDL 23 03/15/2017 0826   LDLCALC 125 (H) 01/15/2021 1150    Physical Exam:   VS:  There were no vitals taken for this visit.   Wt Readings from Last 3 Encounters:  04/13/21 (!) 424 lb (192.3 kg)  04/09/21 (!) 435 lb 12.8 oz (197.7 kg)  01/15/21 (!) 417 lb 6.4 oz (189.3 kg)    General: Well nourished, well developed, in no acute distress Head: Atraumatic, normal size  Eyes: PEERLA, EOMI  Neck: Supple, no JVD Endocrine: No thryomegaly Cardiac: Normal S1, S2; RRR; no murmurs, rubs, or  gallops Lungs: Clear to auscultation bilaterally, no wheezing, rhonchi or rales  Abd: Soft, nontender, no hepatomegaly  Ext: No edema, pulses 2+ Musculoskeletal: No deformities, BUE and BLE strength normal and equal Skin: Warm and dry, no rashes   Neuro: Alert and oriented to person, place, time, and situation, CNII-XII grossly intact, no focal deficits  Psych: Normal mood and affect   ASSESSMENT:   Dean [name] is a 60 y.o. male who presents for the following: No diagnosis found.  PLAN:   There are no diagnoses linked to this encounter.  Disposition: No follow-ups on file.  Medication Adjustments/Labs and Tests Ordered: Current medicines are reviewed at length with the patient today.  Concerns regarding medicines are outlined above.  No orders of the defined types were placed in this encounter.  No orders of the defined types were placed in this encounter.   There are no Patient Instructions on file for this visit.   Time Spent with Patient: I have spent a total of *** minutes with patient reviewing hospital notes, telemetry, EKGs, labs and examining the patient as well as establishing an assessment and plan that was discussed with the patient.  > 50% of time was spent in direct patient care.  Signed, Addison Naegeli. Audie Box, MD, [place]  [address], [place], [place] [address] [phone]  04/13/2021 8:56 PM

## 2021-04-14 ENCOUNTER — Telehealth: Payer: Self-pay | Admitting: Internal Medicine

## 2021-04-14 ENCOUNTER — Ambulatory Visit: Payer: BC Managed Care – PPO | Admitting: Cardiovascular Disease

## 2021-04-14 DIAGNOSIS — E782 Mixed hyperlipidemia: Secondary | ICD-10-CM

## 2021-04-14 DIAGNOSIS — R600 Localized edema: Secondary | ICD-10-CM

## 2021-04-14 DIAGNOSIS — R079 Chest pain, unspecified: Secondary | ICD-10-CM

## 2021-04-14 DIAGNOSIS — R0602 Shortness of breath: Secondary | ICD-10-CM

## 2021-04-14 NOTE — Telephone Encounter (Signed)
Pt called and wants to know if he can be put on insulin. ER put him on insulin and wanted to see if you would prescribe something for him. He has an appt tomorrow with cardiology

## 2021-04-14 NOTE — Telephone Encounter (Signed)
Please have him find out what they gave him specifically at the ER and how much. I can start him on a once nightly long acting insulin but he will need to NOT take the [place] for now and check his blood sugars daily for a few weeks. We can also try once weekly injections or oral medications. We should discuss this at a diabetes visit because there is a lot to discuss.

## 2021-04-14 NOTE — Telephone Encounter (Signed)
Pt is coming in Thursday to discuss medications and about diabetes

## 2021-04-14 NOTE — Telephone Encounter (Signed)
Patient is scheduled for tomorrow 04/27 at 3:00pm with Dr. Gwenlyn Found, sorry about that!

## 2021-04-14 NOTE — Telephone Encounter (Signed)
Pt is doing well. I have contacted cardiology to see if they can get patient in sooner as urgent request was placed

## 2021-04-14 NOTE — Telephone Encounter (Signed)
Dean [name],  Patient was discharged from ER last night due to a PE, DOE, chest pain with breathing. He is suppose to follow-up with cardiology in 3-4 days. We put in an urgent referral. Is there anyway patient can be seen within the next week. He can't wait until 6/14.  Dean [name], CMA form Patient PCP

## 2021-04-15 ENCOUNTER — Other Ambulatory Visit: Payer: Self-pay

## 2021-04-15 ENCOUNTER — Encounter: Payer: Self-pay | Admitting: Cardiovascular Disease

## 2021-04-15 ENCOUNTER — Ambulatory Visit: Payer: BC Managed Care – PPO | Admitting: Cardiovascular Disease

## 2021-04-15 DIAGNOSIS — R609 Edema, unspecified: Secondary | ICD-10-CM

## 2021-04-15 DIAGNOSIS — G4733 Obstructive sleep apnea (adult) (pediatric): Secondary | ICD-10-CM

## 2021-04-15 DIAGNOSIS — E782 Mixed hyperlipidemia: Secondary | ICD-10-CM

## 2021-04-15 DIAGNOSIS — Z86711 Personal history of pulmonary embolism: Secondary | ICD-10-CM

## 2021-04-15 DIAGNOSIS — I10 Essential (primary) hypertension: Secondary | ICD-10-CM | POA: Diagnosis not present

## 2021-04-15 LAB — BASIC METABOLIC PANEL

## 2021-04-15 MED ORDER — FUROSEMIDE 20 MG PO TABS: 20.0000 mg | ORAL_TABLET | Freq: Every day | ORAL | 1 refills | Status: DC

## 2021-04-15 NOTE — Patient Instructions (Signed)
Medication Instructions:   -Start taking furosemide (lasix) 20mg  once daily in 3 days.  *If you need a refill on your cardiac medications before your next appointment, please call your pharmacy*   Lab Work: Your physician recommends that you return for lab work in: 2 weeks for BMET  If you have labs (blood work) drawn today and your tests are completely normal, you will receive your results only by: Marland Kitchen MyChart Message (if you have MyChart) OR . A paper copy in the mail If you have any lab test that is abnormal or we need to change your treatment, we will call you to review the results.   Testing/Procedures: Your physician has requested that you have an echocardiogram. Echocardiography is a painless test that uses sound waves to create images of your heart. It provides your doctor with information about the size and shape of your heart and how well your heart's chambers and valves are working. This procedure takes approximately one hour. There are no restrictions for this procedure. This test is done at [address].    Dr. Gwenlyn Found has ordered a CT coronary calcium score. This test is done at [address]. This is $99 out of pocket.   Coronary CalciumScan A coronary calcium scan is an imaging test used to look for deposits of calcium and other fatty materials (plaques) in the inner lining of the blood vessels of the heart (coronary arteries). These deposits of calcium and plaques can partly clog and narrow the coronary arteries without producing any symptoms or warning signs. This puts a person at risk for a heart attack. This test can detect these deposits before symptoms develop. Tell a health care provider about:  Any allergies you have.  All medicines you are taking, including vitamins, herbs, eye drops, creams, and over-the-counter medicines.  Any problems you or family members have had with anesthetic medicines.  Any blood disorders you  have.  Any surgeries you have had.  Any medical conditions you have.  Whether you are pregnant or may be pregnant. What are the risks? Generally, this is a safe procedure. However, problems may occur, including:  Harm to a pregnant woman and her unborn baby. This test involves the use of radiation. Radiation exposure can be dangerous to a pregnant woman and her unborn baby. If you are pregnant, you generally should not have this procedure done.  Slight increase in the risk of cancer. This is because of the radiation involved in the test. What happens before the procedure? No preparation is needed for this procedure. What happens during the procedure?  You will undress and remove any jewelry around your neck or chest.  You will put on a hospital gown.  Sticky electrodes will be placed on your chest. The electrodes will be connected to an electrocardiogram (ECG) machine to record a tracing of the electrical activity of your heart.  A CT scanner will take pictures of your heart. During this time, you will be asked to lie still and hold your breath for 2-3 seconds while a picture of your heart is being taken. The procedure may vary among health care providers and hospitals. What happens after the procedure?  You can get dressed.  You can return to your normal activities.  It is up to you to get the results of your test. Ask your health care provider, or the department that is doing the test, when your results will be ready. Summary  A  coronary calcium scan is an imaging test used to look for deposits of calcium and other fatty materials (plaques) in the inner lining of the blood vessels of the heart (coronary arteries).  Generally, this is a safe procedure. Tell your health care provider if you are pregnant or may be pregnant.  No preparation is needed for this procedure.  A CT scanner will take pictures of your heart.  You can return to your normal activities after the scan is  done. This information is not intended to replace advice given to you by your health care provider. Make sure you discuss any questions you have with your health care provider. Document Released: 06/03/2008 Document Revised: 10/25/2016 Document Reviewed: 10/25/2016 Elsevier Interactive Patient Education  2017 [place] physician has requested that you have a lower extremity venous duplex. This test is an ultrasound of the veins in the legs. It looks at venous blood flow that carries blood from the heart to the legs. Allow one hour for a Lower Venous exam. There are no restrictions or special instructions. This procedure is done at [place]. 2nd Floor.   Follow-Up: At [facility], you and your health needs are our priority.  As part of our continuing mission to provide you with exceptional heart care, we have created designated Provider Care Teams.  These Care Teams include your primary Cardiologist (physician) and Advanced Practice Providers (APPs -  Physician Assistants and Nurse Practitioners) who all work together to provide you with the care you need, when you need it.  We recommend signing up for the patient portal called "MyChart".  Sign up information is provided on this After Visit Summary.  MyChart is used to connect with patients for Virtual Visits (Telemedicine).  Patients are able to view lab/test results, encounter notes, upcoming appointments, etc.  Non-urgent messages can be sent to your provider as well.   To learn more about what you can do with MyChart, go to [email or web address].    Your next appointment:   3 month(s)  The format for your next appointment:   In Person  Provider:   Quay Burow, MD

## 2021-04-15 NOTE — Assessment & Plan Note (Signed)
Morbid obesity with a BMI of approximately 50.  I am referring him to [facility] Ro [facility] for [facility] and weight loss

## 2021-04-15 NOTE — Assessment & Plan Note (Signed)
History of hyperlipidemia on statin therapy with lipid profile performed 01/15/2021 revealing total cholesterol 207, LDL 125 and HDL of 54.

## 2021-04-15 NOTE — Assessment & Plan Note (Signed)
History of obstructive sleep apnea on BiPAP 

## 2021-04-15 NOTE — Assessment & Plan Note (Signed)
History of essential hypertension a blood pressure measured today 116/74.  He is on lisinopril and hydrochlorothiazide.

## 2021-04-15 NOTE — Assessment & Plan Note (Signed)
Remote history of pulmonary embolism and recent history of pulmonary emboli chest CT performed 04/13/2021 in the right lower segmental onsets subsegmental branches.  His D-dimer was mildly elevated 2.08.  Result of this he was placed on rivaroxaban.  I am going to check lower extremity venous Dopplers to rule out DVT as well.

## 2021-04-15 NOTE — Progress Notes (Signed)
04/15/2021 Kristopher Glee   [birth year]  [id number]  Primary Physician Girtha Rm, NP-C Primary Cardiologist: Lorretta Harp MD Renae Gloss  HPI:  Dean [name] is a 60 y.o. morbidly overweight married Caucasian male father of 3 children who currently works as a Careers adviser at [employer].  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  He has a remote history of pulmonary embolism.  He is also recently been diagnosed with prostate cancer and just finished radiation therapy.  He did see Rosaria Ferries, PA-C in the office 05/08/2018 for symptoms of dyspnea on exertion.  2D echo was ordered but this was never completed.  He does have a history of obstructive sleep apnea on BiPAP.  His cardiovascular risk factor profile otherwise is notable for treated hypertension, diabetes and hyperlipidemia.  His father did have a myocardial infarction at age 48.  He is never had a heart attack or stroke.  He has had increasing shortness of breath recently which led to a chest CT performed 04/13/2021 revealing segmental and subsegmental pulmonary emboli.  He was placed on Xarelto.  Because of lower extremity edema he was also placed on furosemide which resulted in moderate increase in his serum creatinine up to 1.79 and this was discontinued.   Current Meds  Medication Sig  . atorvastatin (LIPITOR) 20 MG tablet Take 1 tablet (20 mg total) by mouth daily.  . Blood Glucose Monitoring Suppl (ONETOUCH VERIO) w/Device KIT 1 each by Other route daily.  . furosemide (LASIX) 40 MG tablet Take 1 tablet (40 mg total) by mouth daily.  . Glucose Blood (BLOOD GLUCOSE TEST STRIPS) STRP Test once day. Pt needs a contour plus one meter.  Marland Kitchen glucose blood (ONETOUCH VERIO) test strip Test once a day. Pt will use a one touch verio meter  . Lancets (ONETOUCH ULTRASOFT) lancets Test once a day. Pt will use a one touch verio meter  .  lisinopril-hydrochlorothiazide (ZESTORETIC) 20-12.5 MG tablet TAKE 1 TABLET BY MOUTH EVERY DAY  . Multiple Vitamin (MULTIVITAMIN) tablet Take 1 tablet by mouth daily.  . rivaroxaban (XARELTO) 20 MG TABS tablet Take 20 mg by mouth daily with supper.     Allergies  Allergen Reactions  . Penicillins Rash and Anaphylaxis    Social History   Socioeconomic History  . Marital status: Unknown    Spouse name: Not on file  . Number of children: Not on file  . Years of education: Not on file  . Highest education level: Not on file  Occupational History  . Not on file  Tobacco Use  . Smoking status: Never Smoker  . Smokeless tobacco: Never Used  Vaping Use  . Vaping Use: Never used  Substance and Sexual Activity  . Alcohol use: Yes    Alcohol/week: 0.0 standard drinks    Comment: 3-4 weekends   . Drug use: No  . Sexual activity: Yes  Other Topics Concern  . Not on file  Social History Narrative  . Not on file   Social Determinants of Health   Financial Resource Strain: Not on file  Food Insecurity: Not on file  Transportation Needs: Not on file  Physical Activity: Not on file  Stress: Not on file  Social Connections: Not on file  Intimate Partner Violence: Not on file     Review of Systems: General: negative for chills, fever, night sweats or weight changes.  Cardiovascular:  negative for chest pain, dyspnea on exertion, edema, orthopnea, palpitations, paroxysmal nocturnal dyspnea or shortness of breath Dermatological: negative for rash Respiratory: negative for cough or wheezing Urologic: negative for hematuria Abdominal: negative for nausea, vomiting, diarrhea, bright red blood per rectum, melena, or hematemesis Neurologic: negative for visual changes, syncope, or dizziness All other systems reviewed and are otherwise negative except as noted above.    Blood pressure 116/74, pulse (!) 108, height 6' 6"  (1.981 m), weight (!) 425 lb (192.8 kg).  General appearance:  alert and no distress Neck: no adenopathy, no carotid bruit, no JVD, supple, symmetrical, trachea midline and thyroid not enlarged, symmetric, no tenderness/mass/nodules Lungs: clear to auscultation bilaterally Heart: regular rate and rhythm, S1, S2 normal, no murmur, click, rub or gallop Extremities: 1-2+ pitting edema bilaterally Pulses: 2+ and symmetric Skin: Skin color, texture, turgor normal. No rashes or lesions Neurologic: Alert and oriented X 3, normal strength and tone. Normal symmetric reflexes. Normal coordination and gait  EKG sinus tachycardia at 108 with right bundle branch block.  Right bundle branch block is new since prior EKGs.  I personally reviewed this EKG.  ASSESSMENT AND PLAN:   Essential hypertension History of essential hypertension a blood pressure measured today 116/74.  He is on lisinopril and hydrochlorothiazide.  History of pulmonary embolus (PE) Remote history of pulmonary embolism and recent history of pulmonary emboli chest CT performed 04/13/2021 in the right lower segmental onsets subsegmental branches.  His D-dimer was mildly elevated 2.08.  Result of this he was placed on rivaroxaban.  I am going to check lower extremity venous Dopplers to rule out DVT as well.  Morbid obesity ([place]) Morbid obesity with a BMI of approximately 50.  I am [facility] and [facility]  Hyperlipidemia History of hyperlipidemia on statin therapy with lipid profile performed 01/15/2021 revealing total cholesterol 207, LDL 125 and HDL of 54.  OSA (obstructive sleep apnea) History of obstructive sleep apnea on BiPAP  Pitting edema History of bilateral lower extremity edema improved with oral furosemide 40 mg a day however serum creatinine did increase to 1.79.  We will give him a few days to equilibrate and restart his furosemide at 20 mg a day.  He did state this resulted in improvement in his lower extremity  edema.      Lorretta Harp MD FACP,FACC,FAHA, [facility] 04/15/2021 3:34 PM

## 2021-04-15 NOTE — Assessment & Plan Note (Signed)
History of bilateral lower extremity edema improved with oral furosemide 40 mg a day however serum creatinine did increase to 1.79.  We will give him a few days to equilibrate and restart his furosemide at 20 mg a day.  He did state this resulted in improvement in his lower extremity edema.

## 2021-04-16 ENCOUNTER — Ambulatory Visit: Payer: BC Managed Care – PPO | Admitting: Family Medicine

## 2021-04-16 ENCOUNTER — Ambulatory Visit (HOSPITAL_COMMUNITY)
Admission: RE | Admit: 2021-04-16 | Discharge: 2021-04-16 | Disposition: A | Discharge status: Home / Self Care | Payer: BC Managed Care – PPO | Source: Ambulatory Visit | Attending: Cardiology | Admitting: Cardiology

## 2021-04-16 ENCOUNTER — Encounter: Payer: Self-pay | Admitting: Family Medicine

## 2021-04-16 VITALS — BP 130/70 | HR 107 | Wt >= 6400 oz

## 2021-04-16 DIAGNOSIS — Z86711 Personal history of pulmonary embolism: Secondary | ICD-10-CM | POA: Insufficient documentation

## 2021-04-16 DIAGNOSIS — E669 Obesity, unspecified: Secondary | ICD-10-CM

## 2021-04-16 DIAGNOSIS — E1169 Type 2 diabetes mellitus with other specified complication: Secondary | ICD-10-CM | POA: Diagnosis not present

## 2021-04-16 MED ORDER — METFORMIN HCL 500 MG PO TABS: 500.0000 mg | ORAL_TABLET | Freq: Two times a day (BID) | ORAL | 1 refills | Status: DC

## 2021-04-16 NOTE — Patient Instructions (Signed)
Your hemoglobin A1c is 8.3% and we would like to get it less than 7% eventually.   Start on Metformin twice daily with meals.   Check your blood sugar fasting (nothing to eat or drink except for water) and the goal range is 80-130.   2 hours after a meal the goal blood sugar range is 130-160.   Do not expect the readings to drop quickly.   Try to cut back on white foods such as sugar, bread, pasta, potatoes, and rice.   I will see you back in 3 months or sooner if needed.       Diabetes Mellitus and Nutrition, Adult When you have diabetes, or diabetes mellitus, it is very important to have healthy eating habits because your blood sugar (glucose) levels are greatly affected by what you eat and drink. Eating healthy foods in the right amounts, at about the same times every day, can help you:  Control your blood glucose.  Lower your risk of heart disease.  Improve your blood pressure.  Reach or maintain a healthy weight. What can affect my meal plan? Every person with diabetes is different, and each person has different needs for a meal plan. Your health care provider may recommend that you work with a dietitian to make a meal plan that is best for you. Your meal plan may vary depending on factors such as:  The calories you need.  The medicines you take.  Your weight.  Your blood glucose, blood pressure, and cholesterol levels.  Your activity level.  Other health conditions you have, such as heart or kidney disease. How do carbohydrates affect me? Carbohydrates, also called carbs, affect your blood glucose level more than any other type of food. Eating carbs naturally raises the amount of glucose in your blood. Carb counting is a method for keeping track of how many carbs you eat. Counting carbs is important to keep your blood glucose at a healthy level, especially if you use insulin or take certain oral diabetes medicines. It is important to know how many carbs you can  safely have in each meal. This is different for every person. Your dietitian can help you calculate how many carbs you should have at each meal and for each snack. How does alcohol affect me? Alcohol can cause a sudden decrease in blood glucose (hypoglycemia), especially if you use insulin or take certain oral diabetes medicines. Hypoglycemia can be a life-threatening condition. Symptoms of hypoglycemia, such as sleepiness, dizziness, and confusion, are similar to symptoms of having too much alcohol.  Do not drink alcohol if: ? Your health care provider tells you not to drink. ? You are pregnant, may be pregnant, or are planning to become pregnant.  If you drink alcohol: ? Do not drink on an empty stomach. ? Limit how much you use to:  0-1 drink a day for women.  0-2 drinks a day for men. ? Be aware of how much alcohol is in your drink. In the [place] one drink equals one 12 oz bottle of beer (355 mL), one 5 oz glass of wine (148 mL), or one 1 oz glass of hard liquor (44 mL). ? Keep yourself hydrated with water, diet soda, or unsweetened iced tea.  Keep in mind that regular soda, juice, and other mixers may contain a lot of sugar and must be counted as carbs. What are tips for following this plan? Reading food labels  Start by checking the serving size on the "Nutrition Facts" label  of packaged foods and drinks. The amount of calories, carbs, fats, and other nutrients listed on the label is based on one serving of the item. Many items contain more than one serving per package.  Check the total grams (g) of carbs in one serving. You can calculate the number of servings of carbs in one serving by dividing the total carbs by 15. For example, if a food has 30 g of total carbs per serving, it would be equal to 2 servings of carbs.  Check the number of grams (g) of saturated fats and trans fats in one serving. Choose foods that have a low amount or none of these fats.  Check the number of  milligrams (mg) of salt (sodium) in one serving. Most people should limit total sodium intake to less than 2,300 mg per day.  Always check the nutrition information of foods labeled as "low-fat" or "nonfat." These foods may be higher in added sugar or refined carbs and should be avoided.  Talk to your dietitian to identify your daily goals for nutrients listed on the label. Shopping  Avoid buying canned, pre-made, or processed foods. These foods tend to be high in fat, sodium, and added sugar.  Shop around the outside edge of the grocery store. This is where you will most often find fresh fruits and vegetables, bulk grains, fresh meats, and fresh dairy. Cooking  Use low-heat cooking methods, such as baking, instead of high-heat cooking methods like deep frying.  Cook using healthy oils, such as olive, canola, or sunflower oil.  Avoid cooking with butter, cream, or high-fat meats. Meal planning  Eat meals and snacks regularly, preferably at the same times every day. Avoid going long periods of time without eating.  Eat foods that are high in fiber, such as fresh fruits, vegetables, beans, and whole grains. Talk with your dietitian about how many servings of carbs you can eat at each meal.  Eat 4-6 oz (112-168 g) of lean protein each day, such as lean meat, chicken, fish, eggs, or tofu. One ounce (oz) of lean protein is equal to: ? 1 oz (28 g) of meat, chicken, or fish. ? 1 egg. ?  cup (62 g) of tofu.  Eat some foods each day that contain healthy fats, such as avocado, nuts, seeds, and fish.   What foods should I eat? Fruits Berries. Apples. Oranges. Peaches. Apricots. Plums. Grapes. Mango. Papaya. Pomegranate. Kiwi. Cherries. Vegetables Lettuce. Spinach. Leafy greens, including kale, chard, collard greens, and mustard greens. Beets. Cauliflower. Cabbage. Broccoli. Carrots. Green beans. Tomatoes. Peppers. Onions. Cucumbers. Brussels sprouts. Grains Whole grains, such as whole-wheat  or whole-grain bread, crackers, tortillas, cereal, and pasta. Unsweetened oatmeal. Quinoa. Brown or wild rice. Meats and other proteins Seafood. Poultry without skin. Lean cuts of poultry and beef. Tofu. Nuts. Seeds. Dairy Low-fat or fat-free dairy products such as milk, yogurt, and cheese. The items listed above may not be a complete list of foods and beverages you can eat. Contact a dietitian for more information. What foods should I avoid? Fruits Fruits canned with syrup. Vegetables Canned vegetables. Frozen vegetables with butter or cream sauce. Grains Refined white flour and flour products such as bread, pasta, snack foods, and cereals. Avoid all processed foods. Meats and other proteins Fatty cuts of meat. Poultry with skin. Breaded or fried meats. Processed meat. Avoid saturated fats. Dairy Full-fat yogurt, cheese, or milk. Beverages Sweetened drinks, such as soda or iced tea. The items listed above may not be a complete  list of foods and beverages you should avoid. Contact a dietitian for more information. Questions to ask a health care provider  Do I need to meet with a diabetes educator?  Do I need to meet with a dietitian?  What number can I call if I have questions?  When are the best times to check my blood glucose? Where to find more information:  American Diabetes Association: diabetes.org  Academy of Nutrition and Dietetics: www.eatright.[employer] of Diabetes and Digestive and Kidney Diseases: [employer]  Association of Diabetes Care and Education Specialists: www.diabeteseducator.org Summary  It is important to have healthy eating habits because your blood sugar (glucose) levels are greatly affected by what you eat and drink.  A healthy meal plan will help you control your blood glucose and maintain a healthy lifestyle.  Your health care provider may recommend that you work with a dietitian to make a meal plan that is best for  you.  Keep in mind that carbohydrates (carbs) and alcohol have immediate effects on your blood glucose levels. It is important to count carbs and to use alcohol carefully. This information is not intended to replace advice given to you by your health care provider. Make sure you discuss any questions you have with your health care provider. Document Revised: 11/13/2019 Document Reviewed: 11/13/2019 Elsevier Patient Education  2021 [employer].

## 2021-04-16 NOTE — Progress Notes (Signed)
   Subjective:    Patient ID: Dean [name], male    DOB: February 18, 1961, 60 y.o.   MRN: [id number]  HPI Chief Complaint  Patient presents with  . discuss diabetes    Discuss diabetes. Had cardiology appt and is having doppler done on legs today, cardiac scoring on 5/26, echo on 5/26   He is here to discuss uncontrolled DM and the need to start on medication.   He has been working on weight loss in the past couple of years and was taking [place]. He stopped this over a month ago and his blood sugars increased.   He is newly on Xarelto for PE.  He is having bilateral LE dopplers done later today to look for DVTs.  Under the care of cardiologist for LE edema, tachycardia and DOE. CT cardiac scoring and echo scheduled.   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   No other concerns today.    Review of Systems Pertinent positives and negatives in the history of present illness.     Objective:   Physical Exam BP 130/70   Pulse (!) 107   Wt (!) 427 lb 6.4 oz (193.9 kg)   BMI 49.39 kg/m   Alert and oriented and in no acute distress. Respirations unlabored.       Assessment & Plan:  Diabetes mellitus type 2 in obese ([place]) - Plan: metFORMIN (GLUCOPHAGE) 500 MG tablet  Discussed spectrum of DM and standards of care.  We decided oral medication would be best for his lifestyle. Metformin 500 mg bid prescribed.  Discussed potential side effects.  He will check his BS fasting and 2 hours after a meal, he can mix up when he checks its.  Cut back on carbohydrates.  Follow up in 3 months or sooner if BS are not improving.  Continue seeing cardiology for work up.

## 2021-04-26 ENCOUNTER — Encounter: Payer: Self-pay | Admitting: Family Medicine

## 2021-04-26 ENCOUNTER — Other Ambulatory Visit: Payer: Self-pay | Admitting: Family Medicine

## 2021-04-26 DIAGNOSIS — I10 Essential (primary) hypertension: Secondary | ICD-10-CM

## 2021-04-27 MED ORDER — BLOOD GLUCOSE TEST VI STRP: ORAL_STRIP | 2 refills | Status: DC

## 2021-05-01 ENCOUNTER — Ambulatory Visit: Payer: BC Managed Care – PPO | Admitting: Physician Assistant

## 2021-05-08 ENCOUNTER — Encounter: Payer: Self-pay | Admitting: Family Medicine

## 2021-05-13 ENCOUNTER — Encounter: Payer: Self-pay | Admitting: Family Medicine

## 2021-05-13 ENCOUNTER — Other Ambulatory Visit: Payer: Self-pay

## 2021-05-13 ENCOUNTER — Ambulatory Visit: Payer: BC Managed Care – PPO | Admitting: Family Medicine

## 2021-05-13 VITALS — BP 120/78 | HR 81 | Wt >= 6400 oz

## 2021-05-13 DIAGNOSIS — E1165 Type 2 diabetes mellitus with hyperglycemia: Secondary | ICD-10-CM | POA: Diagnosis not present

## 2021-05-13 MED ORDER — TRULICITY 0.75 MG/0.5ML ~~LOC~~ SOAJ: 0.7500 mg | SUBCUTANEOUS | 4 refills | Status: DC

## 2021-05-13 MED ORDER — METFORMIN HCL 1000 MG PO TABS: 1000.0000 mg | ORAL_TABLET | Freq: Two times a day (BID) | ORAL | 1 refills | Status: DC

## 2021-05-13 MED ORDER — RIVAROXABAN 20 MG PO TABS: 20.0000 mg | ORAL_TABLET | Freq: Every day | ORAL | 2 refills | Status: DC

## 2021-05-13 NOTE — Progress Notes (Signed)
   Subjective:    Patient ID: Dean [name], male    DOB: 05/06/1961, 60 y.o.   MRN: [id number]  HPI Chief Complaint  Patient presents with  . Blood Sugar Problem    Blood sugar problems. Around 200- 250. Last eye exam last year- some blurred vision due to BS high.    He is here with concerns regarding elevated blood sugars. Hgb A1c 8.3% on 04/09/2021. Started on metformin 500 mg bid. Denies any side effects.  States his FBS (6:30am) has been in the 250 range.  Reports being thirsty and blurred vision but denies frequent urination.   Denies chest pain, palpitations but is still having shortness of breath. This is unchanged and is being worked up by cardiology.  Denies any new symptoms.  Questions how long he will need to be on Xarelto. States he took his last dose yesterday and needs refill.   Reviewed allergies, medications, past medical, surgical, family, and social history.    Review of Systems Pertinent positives and negatives in the history of present illness.     Objective:   Physical Exam BP 120/78   Pulse 81   Wt (!) 432 lb 3.2 oz (196 kg)   BMI 49.95 kg/m   Alert and oriented and in no acute distress. Respirations unlabored.       Assessment & Plan:  Type 2 diabetes mellitus with hyperglycemia, without long-term current use of insulin ([facility])  PCOT glucose- 2 hour post prandial 162 He will increase metformin to 1,000 mg bid and add Trulicity. Samples of Trulicity provided.  Discussed that he will most likely need to stay on Xarelto indefinitely since this is his 2nd PE but he will address this with cardiology. Samples of Xarelto provided.  He will send my his BS readings in 1-2 weeks.  Strongly encouraged him to get a diabetic eye exam.  Follow up for 3 month diabetes check or sooner if needed.

## 2021-05-13 NOTE — Patient Instructions (Signed)
Increase your metformin to 1000 mg with breakfast and supper.  Add Trulicity once weekly injection.  Continue keeping a close eye on your blood sugars and send me a MyChart message in 1 week about your blood sugar readings.  Continue on the Xarelto as discussed.  Discuss this with your cardiologist as well.

## 2021-05-14 ENCOUNTER — Ambulatory Visit (HOSPITAL_COMMUNITY): Payer: BC Managed Care – PPO | Attending: Cardiology

## 2021-05-14 ENCOUNTER — Ambulatory Visit (INDEPENDENT_AMBULATORY_CARE_PROVIDER_SITE_OTHER)
Admission: RE | Admit: 2021-05-14 | Discharge: 2021-05-14 | Disposition: A | Discharge status: Home / Self Care | Payer: Self-pay | Source: Ambulatory Visit | Attending: Cardiovascular Disease | Admitting: Cardiovascular Disease

## 2021-05-14 DIAGNOSIS — I10 Essential (primary) hypertension: Secondary | ICD-10-CM

## 2021-05-14 DIAGNOSIS — E782 Mixed hyperlipidemia: Secondary | ICD-10-CM | POA: Insufficient documentation

## 2021-05-14 DIAGNOSIS — R609 Edema, unspecified: Secondary | ICD-10-CM | POA: Insufficient documentation

## 2021-05-14 LAB — ECHOCARDIOGRAM COMPLETE
Area-P 1/2: 2.6 cm2
S' Lateral: 3 cm

## 2021-05-14 MED ORDER — PERFLUTREN LIPID MICROSPHERE
1.0000 mL | INTRAVENOUS | Status: AC | PRN
  Administered 2021-05-14: 3 mL via INTRAVENOUS

## 2021-05-27 ENCOUNTER — Ambulatory Visit: Payer: BC Managed Care – PPO | Admitting: Family Medicine

## 2021-05-29 ENCOUNTER — Encounter: Payer: Self-pay | Admitting: Family Medicine

## 2021-06-11 DIAGNOSIS — E230 Hypopituitarism: Secondary | ICD-10-CM | POA: Insufficient documentation

## 2021-06-12 ENCOUNTER — Other Ambulatory Visit: Payer: Self-pay

## 2021-06-12 ENCOUNTER — Encounter: Payer: Self-pay | Admitting: Family Medicine

## 2021-06-12 MED ORDER — BLOOD GLUCOSE TEST VI STRP: ORAL_STRIP | 2 refills | Status: DC

## 2021-06-16 ENCOUNTER — Encounter: Payer: Self-pay | Admitting: Family Medicine

## 2021-06-16 ENCOUNTER — Telehealth: Payer: Self-pay | Admitting: Internal Medicine

## 2021-06-16 DIAGNOSIS — Z86711 Personal history of pulmonary embolism: Secondary | ICD-10-CM

## 2021-06-16 DIAGNOSIS — R0602 Shortness of breath: Secondary | ICD-10-CM

## 2021-06-16 DIAGNOSIS — R0609 Other forms of dyspnea: Secondary | ICD-10-CM

## 2021-06-16 NOTE — Telephone Encounter (Signed)
Pt's wife susan called stating that pt is having more shortness of breath, sweating, dizziness. Discomfort with walking. 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 But wife is concerned with hx of blood clots. Pt is currently taking the xarelto as prescribed. Pt had echo done back at end of may. He has an appt at end of July with you. Wife called your office and said she was told she couldn't be seen sooner with you until 7/27. Please advise.  I did advise patient to have patient go be seen at ER with all his symptoms but I would send a message to his cardiology to find out what else could be done.

## 2021-06-16 NOTE — Telephone Encounter (Signed)
Pt also sent a mychart message back to me asking if he needs a referral to pulmonology or a change in his medication for the SOB.

## 2021-06-17 ENCOUNTER — Other Ambulatory Visit: Payer: Self-pay

## 2021-06-17 ENCOUNTER — Encounter: Payer: Self-pay | Admitting: Internal Medicine

## 2021-06-17 DIAGNOSIS — R0602 Shortness of breath: Secondary | ICD-10-CM

## 2021-06-17 DIAGNOSIS — Z01818 Encounter for other preprocedural examination: Secondary | ICD-10-CM

## 2021-06-17 DIAGNOSIS — R072 Precordial pain: Secondary | ICD-10-CM

## 2021-06-17 NOTE — Progress Notes (Addendum)
Placed order for Cor CTA, medication to take prior to test and blood work needed at least 1 week prior to the procedure . I will type instructions for patient give him a call to go over and mail to him.

## 2021-06-17 NOTE — Progress Notes (Signed)
Instructions for Cor CTA

## 2021-06-17 NOTE — Telephone Encounter (Signed)
Spoke with Ellison Carwin with [facility] and she will forward this to nurse with Dr. Gwenlyn Found to get this done. I have put in pulmonology referral by pcp

## 2021-06-18 ENCOUNTER — Other Ambulatory Visit: Payer: Self-pay | Admitting: *Deleted

## 2021-06-18 ENCOUNTER — Other Ambulatory Visit (HOSPITAL_COMMUNITY): Payer: Self-pay | Admitting: Emergency Medicine

## 2021-06-18 DIAGNOSIS — R072 Precordial pain: Secondary | ICD-10-CM

## 2021-06-18 MED ORDER — METOPROLOL TARTRATE 100 MG PO TABS: 100.0000 mg | ORAL_TABLET | Freq: Once | ORAL | 0 refills | Status: DC

## 2021-06-30 ENCOUNTER — Other Ambulatory Visit: Payer: Self-pay

## 2021-06-30 ENCOUNTER — Encounter (INDEPENDENT_AMBULATORY_CARE_PROVIDER_SITE_OTHER): Payer: Self-pay | Admitting: Family Medicine

## 2021-06-30 ENCOUNTER — Ambulatory Visit (INDEPENDENT_AMBULATORY_CARE_PROVIDER_SITE_OTHER): Payer: BC Managed Care – PPO | Admitting: Family Medicine

## 2021-06-30 VITALS — BP 109/69 | HR 82 | Temp 98.1°F | Ht 78.0 in | Wt >= 6400 oz

## 2021-06-30 DIAGNOSIS — G4733 Obstructive sleep apnea (adult) (pediatric): Secondary | ICD-10-CM

## 2021-06-30 DIAGNOSIS — G8929 Other chronic pain: Secondary | ICD-10-CM

## 2021-06-30 DIAGNOSIS — F3289 Other specified depressive episodes: Secondary | ICD-10-CM

## 2021-06-30 DIAGNOSIS — E559 Vitamin D deficiency, unspecified: Secondary | ICD-10-CM | POA: Diagnosis not present

## 2021-06-30 DIAGNOSIS — E65 Localized adiposity: Secondary | ICD-10-CM | POA: Diagnosis not present

## 2021-06-30 DIAGNOSIS — Z6841 Body Mass Index (BMI) 40.0 and over, adult: Secondary | ICD-10-CM

## 2021-06-30 DIAGNOSIS — Z9189 Other specified personal risk factors, not elsewhere classified: Secondary | ICD-10-CM

## 2021-06-30 DIAGNOSIS — E538 Deficiency of other specified B group vitamins: Secondary | ICD-10-CM

## 2021-06-30 DIAGNOSIS — I152 Hypertension secondary to endocrine disorders: Secondary | ICD-10-CM

## 2021-06-30 DIAGNOSIS — Z0289 Encounter for other administrative examinations: Secondary | ICD-10-CM

## 2021-06-30 DIAGNOSIS — E1159 Type 2 diabetes mellitus with other circulatory complications: Secondary | ICD-10-CM

## 2021-06-30 DIAGNOSIS — E291 Testicular hypofunction: Secondary | ICD-10-CM

## 2021-06-30 DIAGNOSIS — Z8546 Personal history of malignant neoplasm of prostate: Secondary | ICD-10-CM

## 2021-06-30 DIAGNOSIS — I2699 Other pulmonary embolism without acute cor pulmonale: Secondary | ICD-10-CM

## 2021-06-30 DIAGNOSIS — E1169 Type 2 diabetes mellitus with other specified complication: Secondary | ICD-10-CM | POA: Insufficient documentation

## 2021-06-30 DIAGNOSIS — R0602 Shortness of breath: Secondary | ICD-10-CM

## 2021-06-30 DIAGNOSIS — Z7901 Long term (current) use of anticoagulants: Secondary | ICD-10-CM

## 2021-06-30 DIAGNOSIS — E785 Hyperlipidemia, unspecified: Secondary | ICD-10-CM

## 2021-06-30 DIAGNOSIS — M545 Low back pain, unspecified: Secondary | ICD-10-CM

## 2021-06-30 DIAGNOSIS — R5383 Other fatigue: Secondary | ICD-10-CM | POA: Diagnosis not present

## 2021-06-30 NOTE — Progress Notes (Signed)
Dear Dr. Gwenlyn Found,   Thank you for referring Dean [name] to our clinic. The following note includes my evaluation and treatment recommendations.  Chief Complaint:   OBESITY Azari Hasler (MR# [id number]) is a 60 y.o. male who presents for evaluation and treatment of obesity and related comorbidities. Current BMI is Body mass index is 48.42 kg/m. Khiyan has been struggling with his weight for many years and has been unsuccessful in either losing weight, maintaining weight loss, or reaching his healthy weight goal.  Wiatt is currently in the action stage of change and ready to dedicate time achieving and maintaining a healthier weight. Tremont is interested in becoming our patient and working on intensive lifestyle modifications including (but not limited to) diet and exercise for weight loss.  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  He lives with his wife.  He says he gets in 5,000 steps per day.  He played for the [employer] for 8 years, playing for the [employer], and [employer].  Shawndell's habits were reviewed today and are as follows: His family eats meals together, he thinks his family will eat healthier with him, his desired weight loss is 115 pounds, he has been heavy most of his life, he started gaining weight after age 64, his heaviest weight ever was 345 pounds, he snacks frequently in the evenings, he skips lunch frequently, he is frequently drinking liquids with calories, and he struggles with emotional eating.  Depression Screen Lamorris's Food and Mood (modified PHQ-9) score was 10.  Depression screen [facility] 2/9 06/30/2021  Decreased Interest 3  Down, Depressed, Hopeless 1  PHQ - 2 Score 4  Altered sleeping 0  Tired, decreased energy 3  Change in appetite 1  Feeling bad or failure about yourself  0  Trouble concentrating 1  Moving slowly or fidgety/restless 1  Suicidal thoughts 0  PHQ-9 Score 10  Difficult doing work/chores Not difficult at all    Assessment/Plan:   1. Other fatigue Lipa admits to daytime somnolence and reports waking up still tired. Patent has a history of symptoms of daytime fatigue, morning fatigue, and snoring. Argyle generally gets 6 hours of sleep per night, and states that he has generally restful sleep. Snoring is present. Apneic episodes are present. Epworth Sleepiness Score is 12.  Burech does feel that his weight is causing his energy to be lower than it should be. Fatigue may be related to obesity, depression or many other causes. Labs will be ordered, and in the meanwhile, Darwyn will focus on self care including making healthy food choices, increasing physical activity and focusing on stress reduction.  2. SOB (shortness of breath) on exertion Shloma notes increasing shortness of breath with exercising and seems to be worsening over time with weight gain. He notes getting out of breath sooner with activity than he used to. This has gotten worse recently. Jeremaih denies shortness of breath at rest or orthopnea.  Marquette does feel that he gets out of breath more easily that he used to when he exercises. Lucifer's shortness of breath appears to be obesity related and exercise induced. He has agreed to work on weight loss and gradually increase exercise to treat his exercise induced shortness of breath. Will continue to monitor closely.  3. Visceral obesity Current visceral fat rating: 33. Visceral fat rating should be < 13. Visceral adipose tissue is a hormonally active component of total body fat. This body composition phenotype is associated with medical disorders such  as metabolic syndrome, cardiovascular disease and several malignancies including prostate, breast, and colorectal cancers. Starting goal: Lose 7-10% of starting weight.   4. Type 2 diabetes mellitus with other specified complication, without long-term current use of insulin (HCC) Diabetes Mellitus: Not at goal. Medication: metformin 1,000 mg  twice daily, Trulicity 2.67 mg subcutaneously weekly. Issues reviewed: blood sugar goals, complications of diabetes mellitus, hypoglycemia prevention and treatment, exercise, and nutrition.   Plan:  Discontinue Trulicity and start Mounjaro 2.5 mg subcutaneously weekly. The patient will continue to focus on protein-rich, low simple carbohydrate foods. We reviewed the importance of hydration, regular exercise for stress reduction, and restorative sleep.   Lab Results  Component Value Date   HGBA1C 8.3 (H) 04/09/2021   HGBA1C 5.8 (A) 01/15/2021   HGBA1C 6.1 (A) 04/02/2020   Lab Results  Component Value Date   LDLCALC 125 (H) 01/15/2021   CREATININE 1.23 04/13/2021   - Start tirzepatide [facility]) 2.5 MG/0.5ML Pen; Inject 2.5 mg into the skin once a week.  Dispense: 2 mL; Refill: 0  5. Hypertension associated with diabetes ([place]) At goal. Medications: Zestoretic 20-12.5 mg daily, Lasix 20 mg daily.   Plan: Avoid buying foods that are: processed, frozen, or prepackaged to avoid excess salt. We will watch for signs of hypotension as he continues lifestyle modifications.  BP Readings from Last 3 Encounters:  07/09/21 126/83  06/30/21 109/69  05/13/21 120/78   Lab Results  Component Value Date   CREATININE 1.23 04/13/2021   6. Hyperlipidemia associated with type 2 diabetes mellitus ([place]) Course: Not at goal. Lipid-lowering medications: Lipitor 20 mg daily.   Plan: Dietary changes: Increase soluble fiber, decrease simple carbohydrates, decrease saturated fat. Exercise changes: Moderate to vigorous-intensity aerobic activity 150 minutes per week or as tolerated. We will continue to monitor along with PCP/specialists as it pertains to his weight loss journey.  Lab Results  Component Value Date   CHOL 207 (H) 01/15/2021   HDL 54 01/15/2021   LDLCALC 125 (H) 01/15/2021   TRIG 158 (H) 01/15/2021   CHOLHDL 3.8 01/15/2021   Lab Results  Component Value Date   ALT 35 04/09/2021   AST 18  04/09/2021   ALKPHOS 107 04/09/2021   BILITOT 0.4 04/09/2021   The 10-year ASCVD risk score Mikey Bussing DC Jr., et al., 2013) is: 13.6%   Values used to calculate the score:     Age: 50 years     Sex: Male     Is Non-Hispanic African American: No     Diabetic: Yes     Tobacco smoker: No     Systolic Blood Pressure: 124 mmHg     Is BP treated: Yes     HDL Cholesterol: 43 mg/dL     Total Cholesterol: 156 mg/dL  7. OSA (obstructive sleep apnea) He is using a CPAP nightly.  Epworth Sleepiness Score is 12.  OSA is a cause of systemic hypertension and is associated with an increased incidence of stroke, heart failure, atrial fibrillation, and coronary heart disease. Severe OSA increases all-cause mortality and cardiovascular mortality.   Goal: Treatment of OSA via CPAP compliance and weight loss. Plasma ghrelin levels (appetite or "hunger hormone") are significantly higher in OSA patients than in BMI-matched controls, but decrease to levels similar to those of obese patients without OSA after CPAP treatment.  Weight loss improves OSA by several mechanisms, including reduction in fatty tissue in the throat (i.e. parapharyngeal fat) and the tongue. Loss of abdominal fat increases mediastinal traction on  the upper airway making it less likely to collapse during sleep. Studies have also shown that compliance with CPAP treatment improves leptin (hunger inhibitory hormone) imbalance.  8. Hypogonadism in male Followed by [facility]. We will continue to monitor symptoms as they relate to his weight loss journey.  9. History of prostate cancer Followed by [facility].  10. 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  11. Vitamin D deficiency Not at goal.  He is taking a daily multivitamin. Plan: Follow-up for routine testing of Vitamin D, at least 2-3 times per year to avoid  over-replacement.  12. B12 deficiency Supplementation:  Oral vitamin B12. Plan:  Continue taking OTC vitamin B12.  13. Chronic low back pain Jaymeson is scheduled for an upcoming ESI. We will continue to monitor symptoms as they relate to his weight loss journey.  14. Other depression, with emotional eating Not at goal. Medication: None.  Plan:  Jerrald eats for comfort. Discussed cues and consequences, how thoughts affect eating, model of thoughts, feelings, and behaviors, and strategies for change by focusing on the cue. Discussed cognitive distortions, coping thoughts, and how to change your thoughts.  15. At risk for heart disease Due to Gavriel's current state of health and medical condition(s), he is at a higher risk for heart disease.  This puts the patient at much greater risk to subsequently develop cardiopulmonary conditions that can significantly affect patient's quality of life in a negative manner.    At least 9 minutes were spent on counseling Adriano about these concerns today. Evidence-based interventions for health behavior change were utilized today including the discussion of self monitoring techniques, problem-solving barriers, and SMART goal setting techniques.  Specifically, regarding patient's less desirable eating habits and patterns, we employed the technique of small changes when Roshad has not been able to fully commit to his prudent nutritional plan.  16. Class 3 severe obesity with serious comorbidity and body mass index (BMI) of 45.0 to 49.9 in adult, unspecified obesity type [facility])  Devyon is currently in the action stage of change and his goal is to continue with weight loss efforts. I recommend Halen begin the structured treatment plan as follows:  He has agreed to the Category 4 Plan.  Exercise goals:  As is.    Behavioral modification strategies: increasing lean protein intake, decreasing simple carbohydrates, increasing vegetables, increasing water intake,  decreasing liquid calories, decreasing alcohol intake, decreasing sodium intake, and increasing high fiber foods.  He was informed of the importance of frequent follow-up visits to maximize his success with intensive lifestyle modifications for his multiple health conditions. He was informed we would discuss his lab results at his next visit unless there is a critical issue that needs to be addressed sooner. Erskin agreed to keep his next visit at the agreed upon time to discuss these results.  Objective:   Blood pressure 109/69, pulse 82, temperature 98.1 F (36.7 C), temperature source Oral, height 6\' 6"  (1.981 m), weight (!) 419 lb (190.1 kg), SpO2 97 %. Body mass index is 48.42 kg/m.  EKG:  Not performed today.  Indirect Calorimeter completed today shows a VO2 of 403 and a REE of 2779.  His calculated basal metabolic rate is 5035 thus his basal metabolic rate is worse than expected.  General: Cooperative, alert, well developed, in no acute distress. HEENT: Conjunctivae and lids unremarkable. Cardiovascular: Regular rhythm.  Lungs: Normal work  of breathing. Neurologic: No focal deficits.   Lab Results  Component Value Date   CREATININE 1.23 04/13/2021   BUN 22 04/13/2021   NA 136 04/13/2021   K 4.5 04/13/2021   CL 96 04/13/2021   CO2 26 04/13/2021   Lab Results  Component Value Date   ALT 35 04/09/2021   AST 18 04/09/2021   ALKPHOS 107 04/09/2021   BILITOT 0.4 04/09/2021   Lab Results  Component Value Date   HGBA1C 8.3 (H) 04/09/2021   HGBA1C 5.8 (A) 01/15/2021   HGBA1C 6.1 (A) 04/02/2020   HGBA1C 6.0 (A) 07/04/2019   HGBA1C 5.6 12/28/2018   Lab Results  Component Value Date   TSH 2.260 04/09/2021   Lab Results  Component Value Date   CHOL 207 (H) 01/15/2021   HDL 54 01/15/2021   LDLCALC 125 (H) 01/15/2021   TRIG 158 (H) 01/15/2021   CHOLHDL 3.8 01/15/2021   Lab Results  Component Value Date   WBC 5.9 04/09/2021   HGB 12.5 (L) 04/09/2021   HCT 38.4  04/09/2021   MCV 93 04/09/2021   PLT 210 04/09/2021   Attestation Statements:   This is the patient's first visit at [facility]. The patient's NEW PATIENT PACKET was reviewed at length. Included in the packet: current and past health history, medications, allergies, ROS, gynecologic history (women only), surgical history, family history, social history, weight history, weight loss surgery history (for those that have had weight loss surgery), nutritional evaluation, mood and food questionnaire, PHQ9, Epworth questionnaire, sleep habits questionnaire, patient life and health improvement goals questionnaire. These will all be scanned into the patient's chart under media.   During the visit, I independently reviewed the patient's EKG, bioimpedance scale results, and indirect calorimeter results. I used this information to tailor a meal plan for the patient that will help him to lose weight and will improve his obesity-related conditions going forward. I performed a medically necessary appropriate examination and/or evaluation. I discussed the assessment and treatment plan with the patient. The patient was provided an opportunity to ask questions and all were answered. The patient agreed with the plan and demonstrated an understanding of the instructions. Labs were ordered at this visit and will be reviewed at the next visit unless more critical results need to be addressed immediately. Clinical information was updated and documented in the EMR.   I, Water quality scientist, CMA, am acting as transcriptionist for Briscoe Deutscher, DO  I have reviewed the above documentation for accuracy and completeness, and I agree with the above. Briscoe Deutscher, DO

## 2021-07-01 ENCOUNTER — Encounter (INDEPENDENT_AMBULATORY_CARE_PROVIDER_SITE_OTHER): Payer: Self-pay

## 2021-07-01 MED ORDER — TIRZEPATIDE 2.5 MG/0.5ML ~~LOC~~ SOAJ: 2.5000 mg | SUBCUTANEOUS | 0 refills | Status: DC

## 2021-07-07 DIAGNOSIS — M19019 Primary osteoarthritis, unspecified shoulder: Secondary | ICD-10-CM | POA: Insufficient documentation

## 2021-07-08 ENCOUNTER — Telehealth (HOSPITAL_COMMUNITY): Payer: Self-pay | Admitting: Emergency Medicine

## 2021-07-08 NOTE — Telephone Encounter (Signed)
Reaching out to patient to offer assistance regarding upcoming cardiac imaging study; pt verbalizes understanding of appt date/time, parking situation and where to check in, pre-test NPO status and medications ordered, and verified current allergies; name and call back number provided for further questions should they arise Marchia Bond RN Navigator Cardiac Imaging [facility] [phone] office [phone] cell  100mg  metoprolol tartrate  Denies iv issues Denies claustro Clarise Cruz

## 2021-07-09 ENCOUNTER — Other Ambulatory Visit: Payer: Self-pay

## 2021-07-09 ENCOUNTER — Ambulatory Visit (HOSPITAL_COMMUNITY)
Admission: RE | Admit: 2021-07-09 | Discharge: 2021-07-09 | Disposition: A | Discharge status: Home / Self Care | Payer: BC Managed Care – PPO | Source: Ambulatory Visit | Attending: Cardiovascular Disease | Admitting: Cardiovascular Disease

## 2021-07-09 ENCOUNTER — Ambulatory Visit: Payer: BC Managed Care – PPO | Admitting: Family Medicine

## 2021-07-09 DIAGNOSIS — R072 Precordial pain: Secondary | ICD-10-CM

## 2021-07-09 DIAGNOSIS — R0602 Shortness of breath: Secondary | ICD-10-CM | POA: Diagnosis present

## 2021-07-09 MED ORDER — NITROGLYCERIN 0.4 MG SL SUBL
SUBLINGUAL_TABLET | SUBLINGUAL | Status: AC
  Filled 2021-07-09: qty 2

## 2021-07-09 MED ORDER — IOHEXOL 350 MG/ML SOLN
100.0000 mL | Freq: Once | INTRAVENOUS | Status: AC | PRN
  Administered 2021-07-09: 100 mL via INTRAVENOUS

## 2021-07-09 MED ORDER — NITROGLYCERIN 0.4 MG SL SUBL
0.8000 mg | SUBLINGUAL_TABLET | Freq: Once | SUBLINGUAL | Status: AC
  Administered 2021-07-09: 0.8 mg via SUBLINGUAL

## 2021-07-10 ENCOUNTER — Ambulatory Visit (HOSPITAL_COMMUNITY): Payer: BC Managed Care – PPO

## 2021-07-14 ENCOUNTER — Ambulatory Visit (INDEPENDENT_AMBULATORY_CARE_PROVIDER_SITE_OTHER): Payer: BC Managed Care – PPO | Admitting: Family Medicine

## 2021-07-14 ENCOUNTER — Encounter (INDEPENDENT_AMBULATORY_CARE_PROVIDER_SITE_OTHER): Payer: Self-pay | Admitting: Family Medicine

## 2021-07-14 ENCOUNTER — Other Ambulatory Visit: Payer: Self-pay

## 2021-07-14 VITALS — BP 112/73 | Temp 98.5°F | Ht 78.0 in | Wt >= 6400 oz

## 2021-07-14 DIAGNOSIS — E1159 Type 2 diabetes mellitus with other circulatory complications: Secondary | ICD-10-CM

## 2021-07-14 DIAGNOSIS — E1169 Type 2 diabetes mellitus with other specified complication: Secondary | ICD-10-CM

## 2021-07-14 DIAGNOSIS — I152 Hypertension secondary to endocrine disorders: Secondary | ICD-10-CM

## 2021-07-14 DIAGNOSIS — Z6841 Body Mass Index (BMI) 40.0 and over, adult: Secondary | ICD-10-CM

## 2021-07-14 DIAGNOSIS — E65 Localized adiposity: Secondary | ICD-10-CM | POA: Diagnosis not present

## 2021-07-14 DIAGNOSIS — Z9189 Other specified personal risk factors, not elsewhere classified: Secondary | ICD-10-CM

## 2021-07-14 DIAGNOSIS — E66813 Obesity, class 3: Secondary | ICD-10-CM

## 2021-07-14 MED ORDER — MOUNJARO 5 MG/0.5ML ~~LOC~~ SOAJ: 5.0000 mg | SUBCUTANEOUS | 0 refills | Status: DC

## 2021-07-15 ENCOUNTER — Encounter: Payer: Self-pay | Admitting: Cardiovascular Disease

## 2021-07-15 ENCOUNTER — Ambulatory Visit (INDEPENDENT_AMBULATORY_CARE_PROVIDER_SITE_OTHER): Payer: BC Managed Care – PPO | Admitting: Cardiovascular Disease

## 2021-07-15 VITALS — BP 124/78 | HR 95 | Ht 78.0 in | Wt >= 6400 oz

## 2021-07-15 DIAGNOSIS — Z86711 Personal history of pulmonary embolism: Secondary | ICD-10-CM | POA: Diagnosis not present

## 2021-07-15 DIAGNOSIS — E782 Mixed hyperlipidemia: Secondary | ICD-10-CM | POA: Diagnosis not present

## 2021-07-15 DIAGNOSIS — I10 Essential (primary) hypertension: Secondary | ICD-10-CM | POA: Diagnosis not present

## 2021-07-15 DIAGNOSIS — R609 Edema, unspecified: Secondary | ICD-10-CM

## 2021-07-15 MED ORDER — ATORVASTATIN CALCIUM 80 MG PO TABS: 80.0000 mg | ORAL_TABLET | Freq: Every day | ORAL | 3 refills | Status: DC

## 2021-07-15 NOTE — Assessment & Plan Note (Signed)
History of pitting edema on as needed diuretics.  His edema is now 1+, markedly improved compared to what it was previously.

## 2021-07-15 NOTE — Assessment & Plan Note (Signed)
History of pulmonary embolism by CTA performed 04/13/2021 after which she was placed on Xarelto.  He has been experiencing increasing dyspnea since that time although recently has gotten improved somewhat.  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

## 2021-07-15 NOTE — Progress Notes (Signed)
07/15/2021 Dean [name]   Apr 17, 1961  [id number]  Primary Physician Girtha Rm, NP-C Primary Cardiologist: Lorretta Harp MD Renae Gloss  HPI:  Dean [name] is a 60 y.o.  morbidly overweight married Caucasian male father of 3 children who currently works as a Careers adviser at [employer].  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  I last saw him in the office 04/15/2021.  He has a remote history of pulmonary embolism.  He is also recently been diagnosed with prostate cancer and just finished radiation therapy.  He did see Rosaria Ferries, PA-C in the office 05/08/2018 for symptoms of dyspnea on exertion.  2D echo was ordered but this was never completed.  He does have a history of obstructive sleep apnea on BiPAP.  His cardiovascular risk factor profile otherwise is notable for treated hypertension, diabetes and hyperlipidemia.  His father did have a myocardial infarction at age 34.  He is never had a heart attack or stroke.  He has had increasing shortness of breath recently which led to a chest CT performed 04/13/2021 revealing segmental and subsegmental pulmonary emboli.  He was placed on Xarelto.  Because of lower extremity edema he was also placed on furosemide which resulted in moderate increase in his serum creatinine up to 1.79 and this was discontinued.  I obtain a 2D echocardiogram that showed normal LV systolic function with grade 1 diastolic dysfunction.  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  He does state that his dyspnea has mildly improved but he still is short of breath compared to prior to his pulmonary embolism.  He scheduled to see Dr. Christinia Gully, pulmonology, tomorrow.     Current Meds  Medication Sig   Blood Glucose Monitoring Suppl (ONETOUCH VERIO) w/Device KIT 1 each by Other route daily.   Glucose Blood (BLOOD  GLUCOSE TEST STRIPS) STRP Test 1-2 times a day. Pt uses one touch ultra meter   Lancets (ONETOUCH ULTRASOFT) lancets Test once a day. Pt will use a one touch verio meter   lisinopril-hydrochlorothiazide (ZESTORETIC) 20-12.5 MG tablet TAKE 1 TABLET BY MOUTH EVERY DAY   Multiple Vitamin (MULTIVITAMIN) tablet Take 1 tablet by mouth daily.   rivaroxaban (XARELTO) 20 MG TABS tablet Take 1 tablet (20 mg total) by mouth daily with supper.   tamsulosin (FLOMAX) 0.4 MG CAPS capsule Take 0.4 mg by mouth at bedtime.   tirzepatide [facility]) 5 MG/0.5ML Pen Inject 5 mg into the skin once a week.   [DISCONTINUED] atorvastatin (LIPITOR) 20 MG tablet Take 1 tablet (20 mg total) by mouth daily.     Allergies  Allergen Reactions   Penicillins Rash and Anaphylaxis    Social History   Socioeconomic History   Marital status: Unknown    Spouse name: Not on file   Number of children: Not on file   Years of education: Not on file   Highest education level: Not on file  Occupational History   Occupation: Research officer, political party  Tobacco Use   Smoking status: Never   Smokeless tobacco: Never  Vaping Use   Vaping Use: Never used  Substance and Sexual Activity   Alcohol use: Yes    Alcohol/week: 0.0 standard drinks    Comment: 3-4 weekends    Drug use: No   Sexual activity: Yes  Other Topics Concern  Not on file  Social History Narrative   Not on file   Social Determinants of Health   Financial Resource Strain: Not on file  Food Insecurity: Not on file  Transportation Needs: Not on file  Physical Activity: Not on file  Stress: Not on file  Social Connections: Not on file  Intimate Partner Violence: Not on file     Review of Systems: General: negative for chills, fever, night sweats or weight changes.  Cardiovascular: negative for chest pain, dyspnea on exertion, edema, orthopnea, palpitations, paroxysmal nocturnal dyspnea or shortness of breath Dermatological: negative for  rash Respiratory: negative for cough or wheezing Urologic: negative for hematuria Abdominal: negative for nausea, vomiting, diarrhea, bright red blood per rectum, melena, or hematemesis Neurologic: negative for visual changes, syncope, or dizziness All other systems reviewed and are otherwise negative except as noted above.    Blood pressure 124/78, pulse 95, height 6' 6"  (1.981 m), weight (!) 424 lb (192.3 kg).  General appearance: alert and no distress Neck: no adenopathy, no carotid bruit, no JVD, supple, symmetrical, trachea midline, and thyroid not enlarged, symmetric, no tenderness/mass/nodules Lungs: clear to auscultation bilaterally Heart: regular rate and rhythm, S1, S2 normal, no murmur, click, rub or gallop Extremities: 1+ pitting edema bilaterally Pulses: 2+ and symmetric Skin: Skin color, texture, turgor normal. No rashes or lesions Neurologic: Grossly normal  EKG not performed today  ASSESSMENT AND PLAN:   Essential hypertension History of essential hypertension with blood pressure measured today at 124/78.  He is on lisinopril/hydrochlorothiazide.  History of pulmonary embolus (PE) History of pulmonary embolism by CTA performed 04/13/2021 after which she was placed on Xarelto.  He has been experiencing increasing dyspnea since that time although recently has gotten improved somewhat.  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  Morbid obesity ([place]) History of morbid obesity with a BMI of 49.  Hyperlipidemia History of hyperlipidemia on low-dose atorvastatin with lipid profile performed 01/15/2021 revealing total cholesterol 207, LDL 125 and HDL 54.  Given his recent coronary CTA revealing a coronary calcium score of 181 I elected to increase his atorvastatin from 20 to 80 mg a day with a goal of LDL less than 70.  Pitting edema History of pitting edema on as needed diuretics.  His edema is now 1+, markedly improved compared to what it was  previously.     Lorretta Harp MD FACP,FACC,FAHA, [facility] 07/15/2021 10:36 AM

## 2021-07-15 NOTE — Assessment & Plan Note (Signed)
History of hyperlipidemia on low-dose atorvastatin with lipid profile performed 01/15/2021 revealing total cholesterol 207, LDL 125 and HDL 54.  Given his recent coronary CTA revealing a coronary calcium score of 181 I elected to increase his atorvastatin from 20 to 80 mg a day with a goal of LDL less than 70.

## 2021-07-15 NOTE — Assessment & Plan Note (Signed)
History of morbid obesity with a BMI of 49.

## 2021-07-15 NOTE — Assessment & Plan Note (Signed)
History of essential hypertension with blood pressure measured today at 124/78.  He is on lisinopril/hydrochlorothiazide.

## 2021-07-15 NOTE — Patient Instructions (Signed)
Medication Instructions:   INCREASE ATORVASTATIN TO 80 MG ONCE DAILY=4 OF THE 20 MG TABLETS ONCE DAILY  *If you need a refill on your cardiac medications before your next appointment, please call your pharmacy*   Lab Work:  Your physician recommends that you return for lab work in: 3 MONTHS-FASTING  If you have labs (blood work) drawn today and your tests are completely normal, you will receive your results only by: [place] (if you have MyChart) OR A paper copy in the mail If you have any lab test that is abnormal or we need to change your treatment, we will call you to review the results.   Follow-Up: At [facility], you and your health needs are our priority.  As part of our continuing mission to provide you with exceptional heart care, we have created designated Provider Care Teams.  These Care Teams include your primary Cardiologist (physician) and Advanced Practice Providers (APPs -  Physician Assistants and Nurse Practitioners) who all work together to provide you with the care you need, when you need it.  We recommend signing up for the patient portal called "MyChart".  Sign up information is provided on this After Visit Summary.  MyChart is used to connect with patients for Virtual Visits (Telemedicine).  Patients are able to view lab/test results, encounter notes, upcoming appointments, etc.  Non-urgent messages can be sent to your provider as well.   To learn more about what you can do with MyChart, go to [email or web address].    Your next appointment:   6 month(s)  The format for your next appointment:   In Person  Provider:   Quay Burow, MD

## 2021-07-20 ENCOUNTER — Ambulatory Visit (INDEPENDENT_AMBULATORY_CARE_PROVIDER_SITE_OTHER): Payer: BC Managed Care – PPO | Admitting: Internal Medicine

## 2021-07-20 ENCOUNTER — Encounter: Payer: Self-pay | Admitting: Internal Medicine

## 2021-07-20 ENCOUNTER — Other Ambulatory Visit: Payer: Self-pay

## 2021-07-20 ENCOUNTER — Other Ambulatory Visit: Payer: Self-pay | Admitting: Internal Medicine

## 2021-07-20 VITALS — BP 130/72 | HR 86 | Temp 98.4°F | Ht 78.0 in | Wt >= 6400 oz

## 2021-07-20 DIAGNOSIS — I10 Essential (primary) hypertension: Secondary | ICD-10-CM

## 2021-07-20 DIAGNOSIS — G4733 Obstructive sleep apnea (adult) (pediatric): Secondary | ICD-10-CM | POA: Diagnosis not present

## 2021-07-20 DIAGNOSIS — R0609 Other forms of dyspnea: Secondary | ICD-10-CM

## 2021-07-20 DIAGNOSIS — R079 Chest pain, unspecified: Secondary | ICD-10-CM

## 2021-07-20 DIAGNOSIS — R0600 Dyspnea, unspecified: Secondary | ICD-10-CM | POA: Diagnosis not present

## 2021-07-20 MED ORDER — OLMESARTAN MEDOXOMIL-HCTZ 20-12.5 MG PO TABS: 1.0000 | ORAL_TABLET | Freq: Every day | ORAL | 2 refills | Status: DC

## 2021-07-20 NOTE — Assessment & Plan Note (Signed)
Severe OSA per sleep study - recommended treatment with BIPAP. Ordered on 06/06/17 - RVE by echo 05/14/21 so ordered ONO on RA 07/20/2021 >>>   Etiology and pathophysiology of osa including relationship to obesity reviewed in detail

## 2021-07-20 NOTE — Assessment & Plan Note (Signed)
Try off acei 07/20/2021 due to unexplained sob   In the best review of chronic cough to date ( NEJM 2016 375 [id number]) ,  ACEi are now felt to cause cough in up to  20% of pts which is a 4 fold increase from previous reports and does not include the variety of non-specific complaints we see in pulmonary clinic in pts on ACEi but previously attributed to another dx like  Copd/asthma and  include PNDS, throat and chest congestion, "bronchitis", unexplained dyspnea and noct "strangling" sensations, and hoarseness, but also  atypical /refractory GERD symptoms like dysphagia and "bad heartburn"   The only way I know  to prove this is not an "ACEi Case" is a trial off ACEi x a minimum of 6 weeks then regroup.   >> try olmesartan 20-12.5 mg one daily and f/u in 4-6 weeks

## 2021-07-20 NOTE — Assessment & Plan Note (Signed)
Sudden onset April 20 202 > pe dx 04/13/21 with R DVT rx xarelto - mild RVE on Echo 05/14/21 with neg myoview  -  07/20/2021   Walked RA  3 laps @ approx 261f each @ avg  pace  stopped due to end of study,  no sob with sats 98% at end  -  V/Q 07/20/2021 >>>  - Venous dopplers 07/20/2021 >>>  - trial off acei 07/20/2021 >>>   Symptoms are markedly disproportionate to objective findings and not clear to what extent this is actually a pulmonary  problem but pt does appear to have difficult to sort out respiratory symptoms of unknown origin for which  DDX  = almost all start with A and  include Adherence, Ace Inhibitors, Acid Reflux, Active Sinus Disease, Alpha 1 Antitripsin deficiency, Anxiety masquerading as Airways dz,  ABPA,  Allergy(esp in young), Aspiration (esp in elderly), Adverse effects of meds,  Active smoking or Vaping, A bunch of PE's/clot burden (a few small clots can't cause this syndrome unless there is already severe underlying pulm or vascular dz with poor reserve),  Anemia or thyroid disorder, plus two Bs  = Bronchiectasis and Beta blocker use..and one C= CHF  ACEi adverse effects at the  top of the usual list of suspects and the only way to rule it out is a trial off > see a/p    A bunch of PEs > doubt while on xarelto but note the studies weren't done on anyone this large so needs v/q to r/o CTEPH and venous dopplers to be sure L dvt gone then major effort at rehab / wt loss next

## 2021-07-20 NOTE — Assessment & Plan Note (Signed)
Body mass index is 48.88 kg/m.     Lab Results  Component Value Date   TSH 2.260 04/09/2021      Contributing to doe and risk of GERD >>>   reviewed the need and the process to achieve and maintain neg calorie balance > defer f/u primary care including intermittently monitoring thyroid status      Each maintenance medication was reviewed in detail including emphasizing most importantly the difference between maintenance and prns and under what circumstances the prns are to be triggered using an action plan format where appropriate.  Total time for H and P, chart review, counseling,  directly observing portions of ambulatory 02 saturation study/ and generating customized AVS unique to this office visit / same day charting = 61 min

## 2021-07-20 NOTE — Progress Notes (Signed)
Chief Complaint:   OBESITY Dean [name] is here to discuss his progress with his obesity treatment plan along with follow-up of his obesity related diagnoses.   Today's visit was #: 2 Starting weight: 419 lbs Starting date: 06/30/2021 Today's weight: 413 lbs Today's date: 07/14/2021 Weight change since last visit: 6 lbs Total lbs lost to date: 6 lbs Body mass index is 47.73 kg/m.  Total weight loss percentage to date: -1.43%  Interim History:  Dean [name] says that he finished up Trulicity, so he has not picked up [facility]  He has been moving during the last 2 weeks.  He says he is now ready to commit to the plan.  He endorses polyphagia.  Current Meal Plan: the Category 4 Plan for 50% of the time.  Current Exercise Plan: Increased walking.  Assessment/Plan:   Meds ordered this encounter  Medications   tirzepatide (MOUNJARO) 5 MG/0.5ML Pen    Sig: Inject 5 mg into the skin once a week.    Dispense:  2 mL    Refill:  0    1. Type 2 diabetes mellitus with other specified complication, without long-term current use of insulin (HCC) Diabetes Mellitus: Not at goal. Medication: Trulicity.  Plan:  Start Mounjaro 5 mg subcutaneously weekly.  The patient will continue to focus on protein-rich, low simple carbohydrate foods. We reviewed the importance of hydration, regular exercise for stress reduction, and restorative sleep.   Lab Results  Component Value Date   HGBA1C 8.3 (H) 04/09/2021   HGBA1C 5.8 (A) 01/15/2021   HGBA1C 6.1 (A) 04/02/2020   Lab Results  Component Value Date   LDLCALC 125 (H) 01/15/2021   CREATININE 1.23 04/13/2021   - Start tirzepatide [facility]) 5 MG/0.5ML Pen; Inject 5 mg into the skin once a week.  Dispense: 2 mL; Refill: 0  2. Visceral obesity Current visceral fat rating: 33. Visceral fat rating should be < 13. Visceral adipose tissue is a hormonally active component of total body fat. This body composition phenotype is associated with medical disorders  such as metabolic syndrome, cardiovascular disease and several malignancies including prostate, breast, and colorectal cancers. Starting goal: Lose 7-10% of starting weight.   3. Hypertension associated with diabetes ([place]) At goal. Medications: None.   Plan: Avoid buying foods that are: processed, frozen, or prepackaged to avoid excess salt. We will watch for signs of hypotension as he continues lifestyle modifications.  BP Readings from Last 3 Encounters:  07/20/21 130/72  07/15/21 124/78  07/14/21 112/73   Lab Results  Component Value Date   CREATININE 1.23 04/13/2021   4. At risk for heart disease Due to Dean [name]'s current state of health and medical condition(s), he is at a higher risk for heart disease.  This puts the patient at much greater risk to subsequently develop cardiopulmonary conditions that can significantly affect patient's quality of life in a negative manner.    At least 8 minutes were spent on counseling Dean [name] about these concerns today. Evidence-based interventions for health behavior change were utilized today including the discussion of self monitoring techniques, problem-solving barriers, and SMART goal setting techniques.  Specifically, regarding patient's less desirable eating habits and patterns, we employed the technique of small changes when Dean [name] has not been able to fully commit to his prudent nutritional plan.  5. Obesity, current BMI 47.8  Course: Dean [name] is currently in the action stage of change. As such, his goal is to continue with weight loss efforts.   Nutrition goals: He has  agreed to the Category 4 Plan.   Exercise goals:  As is.  Behavioral modification strategies: increasing lean protein intake, decreasing simple carbohydrates, increasing vegetables, increasing water intake, and decreasing liquid calories.  Dean [name] has agreed to follow-up with our clinic in 3 weeks. He was informed of the importance of frequent follow-up visits to maximize his  success with intensive lifestyle modifications for his multiple health conditions.   Objective:   Blood pressure 112/73, temperature 98.5 F (36.9 C), temperature source Oral, height '6\' 6"'$  (1.981 m), weight (!) 413 lb (187.3 kg), SpO2 96 %. Body mass index is 47.73 kg/m.  General: Cooperative, alert, well developed, in no acute distress. HEENT: Conjunctivae and lids unremarkable. Cardiovascular: Regular rhythm.  Lungs: Normal work of breathing. Neurologic: No focal deficits.   Lab Results  Component Value Date   CREATININE 1.23 04/13/2021   BUN 22 04/13/2021   NA 136 04/13/2021   K 4.5 04/13/2021   CL 96 04/13/2021   CO2 26 04/13/2021   Lab Results  Component Value Date   ALT 35 04/09/2021   AST 18 04/09/2021   ALKPHOS 107 04/09/2021   BILITOT 0.4 04/09/2021   Lab Results  Component Value Date   HGBA1C 8.3 (H) 04/09/2021   HGBA1C 5.8 (A) 01/15/2021   HGBA1C 6.1 (A) 04/02/2020   HGBA1C 6.0 (A) 07/04/2019   HGBA1C 5.6 12/28/2018   Lab Results  Component Value Date   TSH 2.260 04/09/2021   Lab Results  Component Value Date   CHOL 207 (H) 01/15/2021   HDL 54 01/15/2021   LDLCALC 125 (H) 01/15/2021   TRIG 158 (H) 01/15/2021   CHOLHDL 3.8 01/15/2021   Lab Results  Component Value Date   WBC 5.9 04/09/2021   HGB 12.5 (L) 04/09/2021   HCT 38.4 04/09/2021   MCV 93 04/09/2021   PLT 210 04/09/2021   Attestation Statements:   Reviewed by clinician on day of visit: allergies, medications, problem list, medical history, surgical history, family history, social history, and previous encounter notes.  I, Water quality scientist, CMA, am acting as transcriptionist for Briscoe Deutscher, DO  I have reviewed the above documentation for accuracy and completeness, and I agree with the above. Briscoe Deutscher, DO

## 2021-07-20 NOTE — Progress Notes (Signed)
Dean [name], male    DOB: [birth year],     MRN: [id number]   Brief patient profile:  60 yo  never smoker referred to pulmonary clinic  07/20/2021 by Dr  Dean [name] for doe in setting of h/o PE  2014 on coumadin and 100% back to nl and then went to  A while on hormone therapy for prostate ca developed sob over a week  and leg swelling x  with PE dx April 25th dvt on R> rx xarelto with mild RVE on Echo 05/14/21 and only 25 % back to baseline so referred to pulmonary clinic 07/20/2021 by Dr   Dean [name]    No wt change from prior to April 2022 vs after PE    History of Present Illness  07/20/2021  Pulmonary/ 1st office eval/ Dean [name] / [employer] Office  Chief Complaint  Patient presents with   Consult    DOE   Dyspnea:  [employer] Cough: none  Sleep: flat bed/ 2 pillows / Bipap s 02 , no recent sleep study but feels good in am  SABA use: none  No obvious day to day or daytime variability or assoc excess/ purulent sputum or mucus plugs or hemoptysis or cp or chest tightness, subjective wheeze or overt sinus or hb symptoms.   Sleeping on cpap  without nocturnal  or early am exacerbation  of respiratory  c/o's or need for noct saba. Also denies any obvious fluctuation of symptoms with weather or environmental changes or other aggravating or alleviating factors except as outlined above   No unusual exposure hx or h/o childhood pna/ asthma or knowledge of premature birth.  Current Allergies, Complete Past Medical History, Past Surgical History, Family History, and Social History were reviewed in [employer] record.  ROS  The following are not active complaints unless bolded Hoarseness, sore throat, dysphagia, dental problems, itching, sneezing,  nasal congestion or discharge of excess mucus or purulent secretions, ear ache,   fever, chills, sweats, unintended wt loss or wt gain, classically pleuritic or exertional cp,  orthopnea pnd or arm/hand swelling  or leg swelling,  presyncope, palpitations, abdominal pain, anorexia, nausea, vomiting, diarrhea  or change in bowel habits or change in bladder habits, change in stools or change in urine, dysuria, hematuria,  rash, arthralgias back pain, visual complaints, headache, numbness, weakness or ataxia or problems with walking or coordination,  change in mood or  memory.           Past Medical History:  Diagnosis Date   Aortic atherosclerosis ([place]) 04/13/2021   Back pain    Controlled type 2 diabetes mellitus without complication, without long-term current use of insulin ([place]) 07/06/2016   Elevated PSA    H/O blood clots    Hyperlipidemia    Hypertension    Hypogonadism in male    Joint pain    Lower extremity edema    Morbid obesity ([place])    New onset type 2 diabetes mellitus ([place]) 07/06/2016   PE (pulmonary embolism) 2014   question testosterone pellets as cause   Prostate cancer ([place])    Sleep apnea    SOB (shortness of breath)     Outpatient Medications Prior to Visit  Medication Sig Dispense Refill   atorvastatin (LIPITOR) 80 MG tablet Take 1 tablet (80 mg total) by mouth daily. 90 tablet 3   Blood Glucose Monitoring Suppl (ONETOUCH VERIO) w/Device KIT 1 each by Other route daily. 1 kit 0   Glucose  Blood (BLOOD GLUCOSE TEST STRIPS) STRP Test 1-2 times a day. Pt uses one touch ultra meter 100 strip 2   Lancets (ONETOUCH ULTRASOFT) lancets Test once a day. Pt will use a one touch verio meter 100 each 1   lisinopril-hydrochlorothiazide (ZESTORETIC) 20-12.5 MG tablet TAKE 1 TABLET BY MOUTH EVERY DAY 90 tablet 1   Multiple Vitamin (MULTIVITAMIN) tablet Take 1 tablet by mouth daily.     rivaroxaban (XARELTO) 20 MG TABS tablet Take 1 tablet (20 mg total) by mouth daily with supper. 30 tablet 2   tamsulosin (FLOMAX) 0.4 MG CAPS capsule Take 0.4 mg by mouth at bedtime.     tirzepatide [facility]) 5 MG/0.5ML Pen Inject 5 mg into the skin once a week. 2 mL 0   furosemide (LASIX) 20 MG tablet Take 1 tablet (20  mg total) by mouth daily. (Patient not taking: Reported on 07/15/2021) 90 tablet 1   No facility-administered medications prior to visit.     Objective:     BP 130/72 (BP Location: Left Arm, Cuff Size: Normal)   Pulse 86   Temp 98.4 F (36.9 C) (Oral)   Ht _0  (1.981 m)   Wt (!) 423 lb (191.9 kg)   SpO2 95% Comment: ra  BMI 48.88 kg/m   Note wt 435 when presented with PE in April 2022   SpO2: 95 % (ra)  Obese pleasant amb wm nad    HEENT : pt wearing mask not removed for exam due to covid -19 concerns.    NECK :  without JVD/Nodes/TM/ nl carotid upstrokes bilaterally   LUNGS: no acc muscle use,  Nl contour chest which is clear to A and P bilaterally without cough on insp or exp maneuvers   CV:  RRR  no s3 or murmur or increase in P2, and no edema   ABD:  soft and nontender with nl inspiratory excursion in the supine position. No bruits or organomegaly appreciated, bowel sounds nl  MS:  Nl gait/ ext warm without deformities, calf tenderness, cyanosis or clubbing No obvious joint restrictions   SKIN: warm and dry without lesions    NEURO:  alert, approp, nl sensorium with  no motor or cerebellar deficits apparent.     I personally reviewed images and agree with radiology impression as follows:   Chest CTa  04/13/21  1. Although the quality for evaluation of pulmonary embolism is limited, filling defects within right upper and possibly right lower lobes are suspicious for pulmonary emboli. Given similar distribution of larger volume emboli on 05/16/2013, chronic emboli are a consideration. 2. No evidence of right heart strain.     Assessment   DOE (dyspnea on exertion) Sudden onset April 20 202 > pe dx 04/13/21 with R DVT rx xarelto - mild RVE on Echo 05/14/21 with neg myoview  -  07/20/2021   Walked RA  3 laps @ approx 219f each @ avg  pace  stopped due to end of study,  no sob with sats 98% at end  -  V/Q 07/20/2021 >>>  - Venous dopplers 07/20/2021 >>>  -  trial off acei 07/20/2021 >>>   Symptoms are markedly disproportionate to objective findings and not clear to what extent this is actually a pulmonary  problem but pt does appear to have difficult to sort out respiratory symptoms of unknown origin for which  DDX  = almost all start with A and  include Adherence, Ace Inhibitors, Acid Reflux, Active Sinus Disease, Alpha 1 Antitripsin  deficiency, Anxiety masquerading as Airways dz,  ABPA,  Allergy(esp in young), Aspiration (esp in elderly), Adverse effects of meds,  Active smoking or Vaping, A bunch of PE's/clot burden (a few small clots can't cause this syndrome unless there is already severe underlying pulm or vascular dz with poor reserve),  Anemia or thyroid disorder, plus two Bs  = Bronchiectasis and Beta blocker use..and one C= CHF  ACEi adverse effects at the  top of the usual list of suspects and the only way to rule it out is a trial off > see a/p    A bunch of PEs > doubt while on xarelto but note the studies weren't done on anyone this large so needs v/q to r/o CTEPH and venous dopplers to be sure L dvt gone then major effort at rehab / wt loss next       Essential hypertension Try off acei 07/20/2021 due to unexplained sob   In the best review of chronic cough to date ( NEJM 2016 375 1544-1551) ,  ACEi are now felt to cause cough in up to  20% of pts which is a 4 fold increase from previous reports and does not include the variety of non-specific complaints we see in pulmonary clinic in pts on ACEi but previously attributed to another dx like  Copd/asthma and  include PNDS, throat and chest congestion, "bronchitis", unexplained dyspnea and noct "strangling" sensations, and hoarseness, but also  atypical /refractory GERD symptoms like dysphagia and "bad heartburn"   The only way I know  to prove this is not an "ACEi Case" is a trial off ACEi x a minimum of 6 weeks then regroup.   >> try olmesartan 20-12.5 mg one daily and f/u in 4-6  weeks    OSA (obstructive sleep apnea) Severe OSA per sleep study - recommended treatment with BIPAP. Ordered on 06/06/17 - RVE by echo 05/14/21 so ordered ONO on RA 07/20/2021 >>>   Etiology and pathophysiology of osa including relationship to obesity reviewed in detail       Morbid obesity ([place]) Body mass index is 48.88 kg/m.     Lab Results  Component Value Date   TSH 2.260 04/09/2021      Contributing to doe and risk of GERD >>>   reviewed the need and the process to achieve and maintain neg calorie balance > defer f/u primary care including intermittently monitoring thyroid status      Each maintenance medication was reviewed in detail including emphasizing most importantly the difference between maintenance and prns and under what circumstances the prns are to be triggered using an action plan format where appropriate.  Total time for H and P, chart review, counseling,  directly observing portions of ambulatory 02 saturation study/ and generating customized AVS unique to this office visit / same day charting = 61 min        Christinia Gully, MD 07/20/2021

## 2021-07-20 NOTE — Patient Instructions (Addendum)
We will call to schedule you a V/q lung scan and venous doppler of your R leg and call you the results  Also scheduling ONO on cpap to be sure 02 sats ok sleeping   Stop lisinopril and start olmesartan/hczt  20-12.5 in its place and call me if any problem taking it   To get the most out of exercise, you need to be continuously aware that you are short of breath, but never out of breath, for at least 30 minutes daily. As you improve, it will actually be easier for you to do the same amount of exercise  in  30 minutes so always push to the level where you are short of breath.  Once you can do this, push for longer duration or repeat it after at least 4 hours of rest.   Please schedule a follow up office visit in 4-6 weeks, sooner if needed  - here or in the [place] office, whichever works better for you

## 2021-07-23 ENCOUNTER — Other Ambulatory Visit: Payer: Self-pay

## 2021-07-23 ENCOUNTER — Ambulatory Visit (HOSPITAL_COMMUNITY)
Admission: RE | Admit: 2021-07-23 | Discharge: 2021-07-23 | Disposition: A | Discharge status: Home / Self Care | Payer: BC Managed Care – PPO | Source: Ambulatory Visit | Attending: Internal Medicine | Admitting: Internal Medicine

## 2021-07-23 DIAGNOSIS — R0609 Other forms of dyspnea: Secondary | ICD-10-CM

## 2021-07-23 DIAGNOSIS — R0600 Dyspnea, unspecified: Secondary | ICD-10-CM | POA: Insufficient documentation

## 2021-07-23 NOTE — Progress Notes (Signed)
VASCULAR LAB    Right lower extremity venous duplex has been performed.  See CV proc for preliminary results.   Anastasya Jewell, RVT 07/23/2021, 2:36 PM

## 2021-07-24 ENCOUNTER — Encounter: Payer: Self-pay | Admitting: *Deleted

## 2021-07-28 ENCOUNTER — Other Ambulatory Visit: Payer: Self-pay

## 2021-07-28 ENCOUNTER — Ambulatory Visit
Admission: RE | Admit: 2021-07-28 | Discharge: 2021-07-28 | Disposition: A | Discharge status: Home / Self Care | Payer: BC Managed Care – PPO | Source: Ambulatory Visit | Attending: Internal Medicine | Admitting: Internal Medicine

## 2021-07-28 DIAGNOSIS — R0600 Dyspnea, unspecified: Secondary | ICD-10-CM | POA: Diagnosis not present

## 2021-07-28 DIAGNOSIS — R0609 Other forms of dyspnea: Secondary | ICD-10-CM

## 2021-07-28 MED ORDER — TECHNETIUM TO 99M ALBUMIN AGGREGATED
6.2200 | Freq: Once | INTRAVENOUS | Status: AC | PRN
  Administered 2021-07-28: 6.22 via INTRAVENOUS

## 2021-07-29 ENCOUNTER — Encounter: Payer: Self-pay | Admitting: Internal Medicine

## 2021-07-30 ENCOUNTER — Encounter: Payer: Self-pay | Admitting: *Deleted

## 2021-08-03 ENCOUNTER — Ambulatory Visit (INDEPENDENT_AMBULATORY_CARE_PROVIDER_SITE_OTHER): Payer: BC Managed Care – PPO | Admitting: Family Medicine

## 2021-08-03 ENCOUNTER — Encounter (INDEPENDENT_AMBULATORY_CARE_PROVIDER_SITE_OTHER): Payer: Self-pay | Admitting: Family Medicine

## 2021-08-03 ENCOUNTER — Other Ambulatory Visit: Payer: Self-pay

## 2021-08-03 VITALS — BP 112/73 | HR 87 | Temp 97.9°F | Ht 78.0 in | Wt >= 6400 oz

## 2021-08-03 DIAGNOSIS — Z9189 Other specified personal risk factors, not elsewhere classified: Secondary | ICD-10-CM

## 2021-08-03 DIAGNOSIS — Z86711 Personal history of pulmonary embolism: Secondary | ICD-10-CM | POA: Diagnosis not present

## 2021-08-03 DIAGNOSIS — Z6841 Body Mass Index (BMI) 40.0 and over, adult: Secondary | ICD-10-CM

## 2021-08-03 DIAGNOSIS — E1169 Type 2 diabetes mellitus with other specified complication: Secondary | ICD-10-CM | POA: Diagnosis not present

## 2021-08-03 DIAGNOSIS — G4733 Obstructive sleep apnea (adult) (pediatric): Secondary | ICD-10-CM | POA: Diagnosis not present

## 2021-08-03 DIAGNOSIS — R632 Polyphagia: Secondary | ICD-10-CM | POA: Diagnosis not present

## 2021-08-03 DIAGNOSIS — E66813 Obesity, class 3: Secondary | ICD-10-CM

## 2021-08-03 MED ORDER — TIRZEPATIDE 7.5 MG/0.5ML ~~LOC~~ SOAJ: 7.5000 mg | SUBCUTANEOUS | 0 refills | Status: DC

## 2021-08-03 NOTE — Progress Notes (Signed)
Chief Complaint:   OBESITY Dean [name] is here to discuss his progress with his obesity treatment plan along with follow-up of his obesity related diagnoses.   Today's visit was #: 3 Starting weight: 419 lbs Starting date: 06/30/2021 Today's weight: 412 lbs Today's date: 08/03/2021 Weight change since last visit: -1 lb Total lbs lost to date: 7 lbs Body mass index is 47.61 kg/m.  Total weight loss percentage to date: -1.67%  Current Meal Plan: the Category 4 Plan for 60% of the time.  Current Exercise Plan: Football/walking for 120 minutes 6-7 times per week. Current Anti-Obesity Medications: Mounjaro 5 mg subcutaneously weekly. Side effects: None.  Interim History: Dean [name] reports football camp is wrapping up so he is expecting more of a routine schedule. However, he is working around 70+ hours per week. Dean [name] is decreasing his appetite with no side effects. Today's bioimpedance results indicate that Dean [name] has gained 5 pounds of water weight since his last visit.  Assessment/Plan:   1. Type 2 diabetes mellitus with other specified complication, without long-term current use of insulin (HCC) Diabetes Mellitus: Not at goal. Medication: Mounjaro 5 mg.   Plan: Increase Mounjaro to 7.5 mg, as per below. The patient will continue to focus on protein-rich, low simple carbohydrate foods. We reviewed the importance of hydration, regular exercise for stress reduction, and restorative sleep.   Lab Results  Component Value Date   HGBA1C 8.3 (H) 04/09/2021   HGBA1C 5.8 (A) 01/15/2021   HGBA1C 6.1 (A) 04/02/2020   Lab Results  Component Value Date   LDLCALC 125 (H) 01/15/2021   CREATININE 1.23 04/13/2021   - Start tirzepatide (MOUNJARO) 7.5 MG/0.5ML Pen; Inject 7.5 mg into the skin once a week.  Dispense: 2 mL; Refill: 0  2. OSA (obstructive sleep apnea), on BiPAP OSA is a cause of systemic hypertension and is associated with an increased incidence of stroke, heart failure,  atrial fibrillation, and coronary heart disease. Severe OSA increases all-cause mortality and cardiovascular mortality.   Goal: Treatment of OSA via CPAP compliance and weight loss. Plasma ghrelin levels (appetite or "hunger hormone") are significantly higher in OSA patients than in BMI-matched controls, but decrease to levels similar to those of obese patients without OSA after CPAP treatment.  Weight loss improves OSA by several mechanisms, including reduction in fatty tissue in the throat (i.e. parapharyngeal fat) and the tongue. Loss of abdominal fat increases mediastinal traction on the upper airway making it less likely to collapse during sleep. Studies have also shown that compliance with CPAP treatment improves leptin (hunger inhibitory hormone) imbalance.  3. Polyphagia Improving, but not optimized. Current treatment: Mounjaro 5 mg.   Plan: He will continue to focus on protein-rich, low simple carbohydrate foods. We reviewed the importance of hydration, regular exercise for stress reduction, and restorative sleep. We will increase Mounjaro to 7.5 mg weekly.  4. History of pulmonary embolus (PE) Dean [name] is currently taking Xarelto 20 mg daily for history of PE. We reviewed his recent Pulmonology and Cardiology notes. His images showed resolution.  5. At risk for heart disease Due to Dean [name]'s current state of health and medical condition(s), he is at a higher risk for heart disease.   This puts the patient at much greater risk to subsequently develop cardiopulmonary conditions that can significantly affect patient's quality of life in a negative manner as well.    At least 9 minutes was spent on counseling Dean [name] about these concerns today. Initial goal is to lose at  least 5-10% of starting weight to help reduce these risk factors.  We will continue to reassess these conditions on a fairly regular basis in an attempt to decrease patient's overall morbidity and mortality.  Evidence-based  interventions for health behavior change were utilized today including the discussion of self monitoring techniques, problem-solving barriers and SMART goal setting techniques.  Specifically regarding patient's less desirable eating habits and patterns, we employed the technique of small changes when Dean [name] has not been able to fully commit to his prudent nutritional plan.  6. Obesity, current BMI 47.7 Course: Dean [name] is currently in the action stage of change. As such, his goal is to continue with weight loss efforts.   Nutrition goals: He has agreed to the Category 4 Plan.   Exercise goals: As is.  Behavioral modification strategies: increasing lean protein intake, decreasing simple carbohydrates, increasing vegetables, increasing water intake, decreasing sodium intake, and increasing high fiber foods.  Dean [name] has agreed to follow-up with our clinic in 3 weeks. He was informed of the importance of frequent follow-up visits to maximize his success with intensive lifestyle modifications for his multiple health conditions.   Objective:   Blood pressure 112/73, pulse 87, temperature 97.9 F (36.6 C), temperature source Oral, height '6\' 6"'$  (1.981 m), weight (!) 412 lb (186.9 kg), SpO2 98 %. Body mass index is 47.61 kg/m.  General: Cooperative, alert, well developed, in no acute distress. HEENT: Conjunctivae and lids unremarkable. Cardiovascular: Regular rhythm.  Lungs: Normal work of breathing. Neurologic: No focal deficits.   Lab Results  Component Value Date   CREATININE 1.23 04/13/2021   BUN 22 04/13/2021   NA 136 04/13/2021   K 4.5 04/13/2021   CL 96 04/13/2021   CO2 26 04/13/2021   Lab Results  Component Value Date   ALT 35 04/09/2021   AST 18 04/09/2021   ALKPHOS 107 04/09/2021   BILITOT 0.4 04/09/2021   Lab Results  Component Value Date   HGBA1C 8.3 (H) 04/09/2021   HGBA1C 5.8 (A) 01/15/2021   HGBA1C 6.1 (A) 04/02/2020   HGBA1C 6.0 (A) 07/04/2019   HGBA1C 5.6  12/28/2018   Lab Results  Component Value Date   TSH 2.260 04/09/2021   Lab Results  Component Value Date   CHOL 207 (H) 01/15/2021   HDL 54 01/15/2021   LDLCALC 125 (H) 01/15/2021   TRIG 158 (H) 01/15/2021   CHOLHDL 3.8 01/15/2021   No results found for: VD25OH Lab Results  Component Value Date   WBC 5.9 04/09/2021   HGB 12.5 (L) 04/09/2021   HCT 38.4 04/09/2021   MCV 93 04/09/2021   PLT 210 04/09/2021   Attestation Statements:   Reviewed by clinician on day of visit: allergies, medications, problem list, medical history, surgical history, family history, social history, and previous encounter notes.  Leodis Binet Friedenbach, CMA, am acting as Location manager for [employer], DO.  I have reviewed the above documentation for accuracy and completeness, and I agree with the above. Briscoe Deutscher, DO

## 2021-08-10 ENCOUNTER — Encounter: Payer: Self-pay | Admitting: Internal Medicine

## 2021-08-10 ENCOUNTER — Other Ambulatory Visit: Payer: Self-pay

## 2021-08-10 ENCOUNTER — Ambulatory Visit (INDEPENDENT_AMBULATORY_CARE_PROVIDER_SITE_OTHER): Payer: BC Managed Care – PPO | Admitting: Internal Medicine

## 2021-08-10 DIAGNOSIS — R0600 Dyspnea, unspecified: Secondary | ICD-10-CM

## 2021-08-10 DIAGNOSIS — R0609 Other forms of dyspnea: Secondary | ICD-10-CM

## 2021-08-10 DIAGNOSIS — I10 Essential (primary) hypertension: Secondary | ICD-10-CM | POA: Diagnosis not present

## 2021-08-10 DIAGNOSIS — G4733 Obstructive sleep apnea (adult) (pediatric): Secondary | ICD-10-CM

## 2021-08-10 NOTE — Assessment & Plan Note (Addendum)
Sudden onset April 08 2021 > pe dx 04/13/21 with R DVT rx xarelto - mild RVE on Echo 05/14/21 with neg myoview  - [facility] eval Jun 13 2021 "nl" per pt/ records req (they did not do GXT) -  07/20/2021   Walked RA  3 laps @ approx 269f each @ avg  pace  stopped due to end of study,  no sob with sats 98% at end  -  V/Q 07/28/2021 >>>  Nl  - Venous dopplers  07/23/21 > neg   - trial off acei 07/20/2021 >>> no better 08/10/2021   Really more fatigue than sob and hasn't pushed the envelope with regular aerobic ex since he is somewhat limited by his back so rec:  Sub max ex x 30 min daily  CPST next if not making progress

## 2021-08-10 NOTE — Assessment & Plan Note (Signed)
Try off acei 07/20/2021 due to unexplained sob > no better 08/10/2021   Although even in retrospect it may not be clear the ACEi contributed to the pt's symptoms, adding them back at this point or in the future would risk confusion in interpretation of non-specific respiratory symptoms to which this patient is prone  ie  Better not to muddy the waters here.

## 2021-08-10 NOTE — Patient Instructions (Addendum)
To get the most out of exercise, you need to be continuously aware that you are short of breath, but never out of breath, for at least 30 minutes daily. As you improve, it will actually be easier for you to do the same amount of exercise  in  30 minutes so always push to the level where you are short of breath.  Once you can do this, push for longer duration.  Make sure you check your oxygen saturations at highest level of activity   Send in your results of your [employer] work up   If not making progress with ex tolerance improving the next step is a CPST

## 2021-08-10 NOTE — Progress Notes (Signed)
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   [facility] w/u was done in June 26-28th 2022 "wnl"   History of Present Illness  07/20/2021  Pulmonary/ 1st office eval/ Babatunde Seago / [employer] Office  Chief Complaint  Patient presents with   Consult    DOE  Dyspnea:  4 aisles at [employer] Cough: none  Sleep: flat bed/ 2 pillows / Bipap s 02 , no recent sleep study but feels good in am  SABA use: none  Rec We will call to schedule you a V/q lung scan and venous doppler of your R leg and call you the results Also scheduling ONO on cpap to be sure 02 sats ok sleeping >>>   Stop lisinopril and start olmesartan/hczt  20-12.5 in its place and call me if any problem taking it  To get the most out of exercise, you need to be continuously aware that you are short of breath,    08/10/2021  f/u ov/Hy Swiatek re: sob p pe  maint on xorelto   Chief Complaint  Patient presents with   Follow-up    Fatigue and SOB   - mild RVE on Echo 05/14/21 with neg myoview  -  07/20/2021   Walked RA  3 laps @ approx 249f each @ avg  pace  stopped due to end of study,  no sob with sats 98% at end  -  V/Q 07/28/2021 >>>  Nl  - Venous dopplers  07/23/21 > neg   - trial off acei 07/20/2021 >>> no better  Dyspnea:  still has to stop doing [employer] p several aisles, more due to fatigue than sob  Cough: none  Sleeping: bipap s  hs x 5 years  > [facility]fl w/u "fine" late June 2022 records requesated  SABA use: none  02: none  Covid status:   x  3  vax/ never infected    Has done with and without bipap thru [facility]    No obvious day to day or daytime variability or assoc excess/ purulent sputum or mucus plugs or hemoptysis or cp or chest tightness, subjective wheeze or overt sinus or hb symptoms.   Sleeping as above  without nocturnal  or early am exacerbation  of respiratory  c/o's or need for noct saba. Also denies any obvious fluctuation of symptoms with weather or environmental changes or other aggravating or alleviating factors except as outlined above   No unusual exposure hx or h/o childhood pna/ asthma or knowledge of premature birth.  Current Allergies, Complete Past Medical History, Past Surgical History, Family History, and Social History were reviewed in [employer].  ROS  The  following are not active complaints unless bolded Hoarseness, sore throat, dysphagia, dental problems, itching, sneezing,  nasal congestion or discharge of excess mucus or purulent secretions, ear ache,   fever, chills, sweats, unintended wt loss or wt gain, classically pleuritic or exertional cp,  orthopnea pnd or arm/hand swelling  or leg swelling, presyncope, palpitations, abdominal pain, anorexia, nausea, vomiting, diarrhea  or change in bowel habits or change in bladder habits, change in stools or change in urine, dysuria, hematuria,  rash, arthralgias, visual complaints, headache, numbness, weakness or ataxia or problems with walking or coordination,  change in mood or  memory.        Current Meds  Medication Sig   atorvastatin (LIPITOR) 80 MG tablet Take 1 tablet (80 mg total) by mouth daily.   Blood Glucose Monitoring Suppl (ONETOUCH VERIO) w/Device KIT 1 each by Other route daily.   Glucose Blood (BLOOD GLUCOSE TEST STRIPS) STRP Test 1-2 times a day. Pt uses one touch ultra meter   Lancets (ONETOUCH  ULTRASOFT) lancets Test once a day. Pt will use a one touch verio meter   Multiple Vitamin (MULTIVITAMIN) tablet Take 1 tablet by mouth daily.   olmesartan-hydrochlorothiazide (BENICAR HCT) 20-12.5 MG tablet Take 1 tablet by mouth daily.   rivaroxaban (XARELTO) 20 MG TABS tablet Take 1 tablet (20 mg total) by mouth daily with supper.   tamsulosin (FLOMAX) 0.4 MG CAPS capsule Take 0.4 mg by mouth at bedtime.   tirzepatide (MOUNJARO) 7.5 MG/0.5ML Pen Inject 7.5 mg into the skin once a week.       Past Medical History:  Diagnosis Date   Aortic atherosclerosis ([place]) 04/13/2021   Back pain    Controlled type 2 diabetes mellitus without complication, without long-term current use of insulin ([place]) 07/06/2016   Elevated PSA    H/O blood clots    Hyperlipidemia    Hypertension    Hypogonadism in male    Joint pain    Lower extremity edema    Morbid obesity ([place])    New onset type 2 diabetes mellitus ([place]) 07/06/2016   PE (pulmonary embolism) 2014   question testosterone pellets as cause   Prostate cancer ([place])    Sleep apnea    SOB (shortness of breath)        Objective:       Wt Readings from Last 3 Encounters:  08/10/21 (!) 420 lb (190.5 kg)  08/03/21 (!) 412 lb (186.9 kg)  07/20/21 (!) 423 lb (191.9 kg)      Vital signs reviewed  08/10/2021  - Note at rest 02 sats  99% on RA   General appearance:    amb obese somber wm nad       HEENT : pt wearing mask not removed for exam due to covid -19 concerns.    NECK :  without JVD/Nodes/TM/ nl carotid upstrokes bilaterally   LUNGS: no acc muscle use,  Nl contour chest which is clear to A and P bilaterally without cough on insp or exp maneuvers   CV:  RRR  no s3 or murmur or increase in P2, and no edema   ABD:  quite obese/ soft and nontender with nl inspiratory excursion in the supine position. No bruits or organomegaly appreciated, bowel sounds nl  MS:  Nl gait/ ext warm without deformities, calf tenderness, cyanosis or  clubbing No obvious joint restrictions   SKIN: warm and dry without lesions    NEURO:  alert, approp, nl sensorium with  no motor  or cerebellar deficits apparent.      Assessment

## 2021-08-10 NOTE — Assessment & Plan Note (Signed)
Severe OSA per sleep study - recommended treatment with BIPAP. Ordered on 06/06/17 - RVE by echo 05/14/21 so rec ONO on bipap but informed 08/10/2021 that [facility] w/u June 25 at [facility] was "fine" > recs requested

## 2021-08-10 NOTE — Assessment & Plan Note (Signed)
Body mass index is 48.54 kg/m.  -  trending up Lab Results  Component Value Date   TSH 2.260 04/09/2021      Contributing to doe and risk of GERD >>>   reviewed the need and the process to achieve and maintain neg calorie balance > defer f/u primary care including intermittently monitoring thyroid status     Pulmonary f/u prn          Each maintenance medication was reviewed in detail including emphasizing most importantly the difference between maintenance and prns and under what circumstances the prns are to be triggered using an action plan format where appropriate.  Total time for H and P, chart review, counseling  and generating customized AVS unique to this office visit / same day charting = 33 min

## 2021-09-01 ENCOUNTER — Telehealth: Payer: Self-pay

## 2021-09-01 NOTE — Telephone Encounter (Signed)
Left message, Dean [name] wants pt to schedule appt to discuss Snap sleep study results

## 2021-09-07 ENCOUNTER — Encounter (INDEPENDENT_AMBULATORY_CARE_PROVIDER_SITE_OTHER): Payer: Self-pay | Admitting: Family Medicine

## 2021-09-07 ENCOUNTER — Ambulatory Visit (INDEPENDENT_AMBULATORY_CARE_PROVIDER_SITE_OTHER): Payer: BC Managed Care – PPO | Admitting: Family Medicine

## 2021-09-07 ENCOUNTER — Other Ambulatory Visit: Payer: Self-pay

## 2021-09-07 VITALS — BP 104/68 | HR 98 | Temp 97.8°F | Ht 78.0 in | Wt 399.0 lb

## 2021-09-07 DIAGNOSIS — E65 Localized adiposity: Secondary | ICD-10-CM | POA: Diagnosis not present

## 2021-09-07 DIAGNOSIS — R632 Polyphagia: Secondary | ICD-10-CM | POA: Diagnosis not present

## 2021-09-07 DIAGNOSIS — Z9189 Other specified personal risk factors, not elsewhere classified: Secondary | ICD-10-CM

## 2021-09-07 DIAGNOSIS — E1169 Type 2 diabetes mellitus with other specified complication: Secondary | ICD-10-CM

## 2021-09-07 DIAGNOSIS — Z6841 Body Mass Index (BMI) 40.0 and over, adult: Secondary | ICD-10-CM

## 2021-09-07 MED ORDER — TIRZEPATIDE 10 MG/0.5ML ~~LOC~~ SOAJ: 10.0000 mg | SUBCUTANEOUS | 0 refills | Status: DC

## 2021-09-07 NOTE — Progress Notes (Signed)
Chief Complaint:   OBESITY Dean [name] is here to discuss his progress with his obesity treatment plan along with follow-up of his obesity related diagnoses.   Today's visit was #: 4 Starting weight: 419 lbs Starting date: 06/30/2021 Today's weight: 399 lbs Today's date: 09/07/2021 Weight change since last visit: 13 lbs Total lbs lost to date: 20 lbs Body mass index is 46.11 kg/m.  Total weight loss percentage to date: -4.77%  Current Meal Plan: the Category 4 Plan for 60-70% of the time.  Current Exercise Plan: Increased walking. Current Anti-Obesity Medications: Mounjaro 7.5 mg .subuc weekly. Side effects: None.  Interim History:  Dean [name] says he is tolerating Mounjaro well.  No side effects.  Denies polyphagia.  Assessment/Plan:   1. Type 2 diabetes mellitus with other specified complication, without long-term current use of insulin (HCC) Diabetes Mellitus: Not at goal. Medication: Mounjaro 7.5 mg subcutaneously weekly.   Plan:  Increase Mounjaro to 10 mg subcutaneously weekly.  The patient will continue to focus on protein-rich, low simple carbohydrate foods. We reviewed the importance of hydration, regular exercise for stress reduction, and restorative sleep.   Lab Results  Component Value Date   HGBA1C 8.3 (H) 04/09/2021   HGBA1C 5.8 (A) 01/15/2021   HGBA1C 6.1 (A) 04/02/2020   Lab Results  Component Value Date   LDLCALC 125 (H) 01/15/2021   CREATININE 1.23 04/13/2021   - Increase and refill tirzepatide (MOUNJARO) 10 MG/0.5ML Pen; Inject 10 mg into the skin once a week.  Dispense: 2 mL; Refill: 0  2. Visceral obesity Current visceral fat rating: 32. Visceral fat rating should be < 13. Visceral adipose tissue is a hormonally active component of total body fat. This body composition phenotype is associated with medical disorders such as metabolic syndrome, cardiovascular disease and several malignancies including prostate, breast, and colorectal cancers. Starting goal:  Lose 7-10% of starting weight.   3. Polyphagia Controlled. Current treatment: Mounjaro 7.5 mg subcutaneously weekly. He will continue to focus on protein-rich, low simple carbohydrate foods. We reviewed the importance of hydration, regular exercise for stress reduction, and restorative sleep.  4. At risk for heart disease Due to Dean [name]'s current state of health and medical condition(s), he is at a higher risk for heart disease.  This puts the patient at much greater risk to subsequently develop cardiopulmonary conditions that can significantly affect patient's quality of life in a negative manner.    At least 8 minutes were spent on counseling Dean [name] about these concerns today. Evidence-based interventions for health behavior change were utilized today including the discussion of self monitoring techniques, problem-solving barriers, and SMART goal setting techniques.  Specifically, regarding patient's less desirable eating habits and patterns, we employed the technique of small changes when Dean [name] has not been able to fully commit to his prudent nutritional plan.  5. Obesity, current BMI 46.2  Course: Dean [name] is currently in the action stage of change. As such, his goal is to continue with weight loss efforts.   Nutrition goals: He has agreed to the Category 4 Plan.   Exercise goals:  As is.  Behavioral modification strategies: increasing lean protein intake, decreasing simple carbohydrates, increasing vegetables, and increasing water intake.  Dean [name] has agreed to follow-up with our clinic in 4 weeks. He was informed of the importance of frequent follow-up visits to maximize his success with intensive lifestyle modifications for his multiple health conditions.   Objective:   Blood pressure 104/68, pulse 98, temperature 97.8 F (36.6 C), temperature source  Oral, height '6\' 6"'$  (1.981 m), weight (!) 399 lb (181 kg), SpO2 97 %. Body mass index is 46.11 kg/m.  General: Cooperative, alert, well  developed, in no acute distress. HEENT: Conjunctivae and lids unremarkable. Cardiovascular: Regular rhythm.  Lungs: Normal work of breathing. Neurologic: No focal deficits.   Lab Results  Component Value Date   CREATININE 1.23 04/13/2021   Dean [name] 22 04/13/2021   NA 136 04/13/2021   K 4.5 04/13/2021   CL 96 04/13/2021   CO2 26 04/13/2021   Lab Results  Component Value Date   ALT 35 04/09/2021   AST 18 04/09/2021   ALKPHOS 107 04/09/2021   BILITOT 0.4 04/09/2021   Lab Results  Component Value Date   HGBA1C 8.3 (H) 04/09/2021   HGBA1C 5.8 (A) 01/15/2021   HGBA1C 6.1 (A) 04/02/2020   HGBA1C 6.0 (A) 07/04/2019   HGBA1C 5.6 12/28/2018   Lab Results  Component Value Date   TSH 2.260 04/09/2021   Lab Results  Component Value Date   CHOL 207 (H) 01/15/2021   HDL 54 01/15/2021   LDLCALC 125 (H) 01/15/2021   TRIG 158 (H) 01/15/2021   CHOLHDL 3.8 01/15/2021   Lab Results  Component Value Date   WBC 5.9 04/09/2021   HGB 12.5 (L) 04/09/2021   HCT 38.4 04/09/2021   MCV 93 04/09/2021   PLT 210 04/09/2021   Attestation Statements:   Reviewed by clinician on day of visit: allergies, medications, problem list, medical history, surgical history, family history, social history, and previous encounter notes.  I, Water quality scientist, CMA, am acting as transcriptionist for Briscoe Deutscher, DO  I have reviewed the above documentation for accuracy and completeness, and I agree with the above. Briscoe Deutscher, DO

## 2021-09-10 ENCOUNTER — Other Ambulatory Visit: Payer: Self-pay | Admitting: Family Medicine

## 2021-10-05 ENCOUNTER — Ambulatory Visit (INDEPENDENT_AMBULATORY_CARE_PROVIDER_SITE_OTHER): Payer: BC Managed Care – PPO | Admitting: Family Medicine

## 2021-10-05 ENCOUNTER — Encounter (INDEPENDENT_AMBULATORY_CARE_PROVIDER_SITE_OTHER): Payer: Self-pay | Admitting: Family Medicine

## 2021-10-05 ENCOUNTER — Other Ambulatory Visit: Payer: Self-pay

## 2021-10-05 VITALS — BP 109/64 | HR 93 | Temp 97.9°F | Ht 78.0 in | Wt 398.0 lb

## 2021-10-05 DIAGNOSIS — E1169 Type 2 diabetes mellitus with other specified complication: Secondary | ICD-10-CM

## 2021-10-05 DIAGNOSIS — I152 Hypertension secondary to endocrine disorders: Secondary | ICD-10-CM

## 2021-10-05 DIAGNOSIS — E65 Localized adiposity: Secondary | ICD-10-CM

## 2021-10-05 DIAGNOSIS — E1159 Type 2 diabetes mellitus with other circulatory complications: Secondary | ICD-10-CM | POA: Diagnosis not present

## 2021-10-05 DIAGNOSIS — E559 Vitamin D deficiency, unspecified: Secondary | ICD-10-CM | POA: Diagnosis not present

## 2021-10-05 DIAGNOSIS — E785 Hyperlipidemia, unspecified: Secondary | ICD-10-CM

## 2021-10-05 DIAGNOSIS — E538 Deficiency of other specified B group vitamins: Secondary | ICD-10-CM | POA: Diagnosis not present

## 2021-10-05 DIAGNOSIS — Z6841 Body Mass Index (BMI) 40.0 and over, adult: Secondary | ICD-10-CM

## 2021-10-05 MED ORDER — TIRZEPATIDE 12.5 MG/0.5ML ~~LOC~~ SOAJ: 12.5000 mg | SUBCUTANEOUS | 0 refills | Status: DC

## 2021-10-06 NOTE — Progress Notes (Signed)
Chief Complaint:   OBESITY Dean [name] is here to discuss his progress with his obesity treatment plan along with follow-up of his obesity related diagnoses. See Medical Weight Management Flowsheet for complete bioelectrical impedance results.  Today's visit was #: 5 Starting weight: 419 lbs Starting date: 06/30/2021 Weight change since last visit: 1 lb Total lbs lost to date: 21 lbs Total weight loss percentage to date: -5.01%  Nutrition Plan: Category 4 Meal Plan for 50-60% of the time. Activity: Increased activity. Anti-obesity medications: Mounjaro 10 mg subcutaneously weekly. Reported side effects: None.  Interim History: Dean [name] says he has tolerated Mounjaro 10 mg with no side effects.  He has had some orthostatic symptoms.  He reports getting in his protein.  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  Assessment/Plan:   1. Type 2 diabetes mellitus with other specified complication, without long-term current use of insulin (HCC) Diabetes Mellitus: Not at goal. Medication: Mounjaro 10 mg subcutaneously weekly.   Plan:  Increase Mounjaro to 12.5 mg subcutaneously weekly.  Will check CMP and A1c at next visit. The patient will continue to focus on protein-rich, low simple carbohydrate foods. We reviewed the importance of hydration, regular exercise for stress reduction, and restorative sleep.   Lab Results  Component Value Date   HGBA1C 8.3 (H) 04/09/2021   HGBA1C 5.8 (A) 01/15/2021   HGBA1C 6.1 (A) 04/02/2020   Lab Results  Component Value Date   LDLCALC 125 (H) 01/15/2021   CREATININE 1.23 04/13/2021   - Increase tirzepatide (MOUNJARO) 12.5 MG/0.5ML Pen; Inject 12.5 mg into the skin once a week.  Dispense: 2 mL; Refill: 0 - Comprehensive metabolic panel - Hemoglobin A1c  2. Hypertension associated with diabetes ([place]) Low with orthostatic symptoms.  Medications: olmesartan-HCTZ 20-12.5 mg daily.   Plan:  Decrease olmesartan-HCTZ to 1/2 tablet  daily.  Avoid buying foods that are: processed, frozen, or prepackaged to avoid excess salt. We will watch for signs of hypotension as he continues lifestyle modifications.  BP Readings from Last 3 Encounters:  10/05/21 109/64  09/07/21 104/68  08/10/21 116/74   Lab Results  Component Value Date   CREATININE 1.23 04/13/2021   3. Hyperlipidemia associated with type 2 diabetes mellitus ([place]) Course: Not at goal. Lipid-lowering medications: Lipitor 80 mg daily.   Plan: Dietary changes: Increase soluble fiber, decrease simple carbohydrates, decrease saturated fat. Exercise changes: Moderate to vigorous-intensity aerobic activity 150 minutes per week or as tolerated. We will continue to monitor along with PCP/specialists as it pertains to his weight loss journey.  Check labs at next visit.  Lab Results  Component Value Date   CHOL 207 (H) 01/15/2021   HDL 54 01/15/2021   LDLCALC 125 (H) 01/15/2021   TRIG 158 (H) 01/15/2021   CHOLHDL 3.8 01/15/2021   Lab Results  Component Value Date   ALT 35 04/09/2021   AST 18 04/09/2021   ALKPHOS 107 04/09/2021   BILITOT 0.4 04/09/2021   The 10-year ASCVD risk score (Arnett DK, et al., 2019) is: 11.6%   Values used to calculate the score:     Age: 60 years     Sex: Male     Is Non-Hispanic African American: No     Diabetic: Yes     Tobacco smoker: No     Systolic Blood Pressure: 675 mmHg     Is BP treated: Yes     HDL Cholesterol: 43 mg/dL     Total Cholesterol:  156 mg/dL  - Lipid Panel With LDL/HDL Ratio  4. Vitamin D deficiency Dean [name] is taking a daily multivitamin.  Plan: Continue current OTC vitamin D supplementation. Will check vitamin D level at next office visit.  - VITAMIN D 25 Hydroxy (Vit-D Deficiency, Fractures)  5. B12 deficiency  Supplementation: Daily multivitamin.   Plan:  Will check vitamin B12 level at next visit.  - Vitamin B12  6. Visceral obesity Current visceral fat rating: 32. Visceral fat rating should  be < 13. Visceral adipose tissue is a hormonally active component of total body fat. This body composition phenotype is associated with medical disorders such as metabolic syndrome, cardiovascular disease and several malignancies including prostate, breast, and colorectal cancers. Starting goal: Lose 7-10% of starting weight.   7. Obesity, current BMI 46  Course: Dean [name] is currently in the action stage of change. As such, his goal is to continue with weight loss efforts.   Nutrition goals: He has agreed to the Category 3 Plan.   Exercise goals:  As is.  Behavioral modification strategies: increasing lean protein intake, decreasing simple carbohydrates, increasing vegetables, and increasing water intake.  Dean [name] has agreed to follow-up with our clinic in 4 weeks. He was informed of the importance of frequent follow-up visits to maximize his success with intensive lifestyle modifications for his multiple health conditions.   Objective:   Blood pressure 109/64, pulse 93, temperature 97.9 F (36.6 C), temperature source Oral, height 6\' 6"  (1.981 m), weight (!) 398 lb (180.5 kg), SpO2 95 %. Body mass index is 45.99 kg/m.  General: Cooperative, alert, well developed, in no acute distress. HEENT: Conjunctivae and lids unremarkable. Cardiovascular: Regular rhythm.  Lungs: Normal work of breathing. Neurologic: No focal deficits.   Lab Results  Component Value Date   CREATININE 1.23 04/13/2021   BUN 22 04/13/2021   NA 136 04/13/2021   K 4.5 04/13/2021   CL 96 04/13/2021   CO2 26 04/13/2021   Lab Results  Component Value Date   ALT 35 04/09/2021   AST 18 04/09/2021   ALKPHOS 107 04/09/2021   BILITOT 0.4 04/09/2021   Lab Results  Component Value Date   HGBA1C 8.3 (H) 04/09/2021   HGBA1C 5.8 (A) 01/15/2021   HGBA1C 6.1 (A) 04/02/2020   HGBA1C 6.0 (A) 07/04/2019   HGBA1C 5.6 12/28/2018   Lab Results  Component Value Date   TSH 2.260 04/09/2021   Lab Results  Component Value  Date   CHOL 207 (H) 01/15/2021   HDL 54 01/15/2021   LDLCALC 125 (H) 01/15/2021   TRIG 158 (H) 01/15/2021   CHOLHDL 3.8 01/15/2021   Lab Results  Component Value Date   WBC 5.9 04/09/2021   HGB 12.5 (L) 04/09/2021   HCT 38.4 04/09/2021   MCV 93 04/09/2021   PLT 210 04/09/2021   Attestation Statements:   Reviewed by clinician on day of visit: allergies, medications, problem list, medical history, surgical history, family history, social history, and previous encounter notes.  I, Water quality scientist, CMA, am acting as transcriptionist for Briscoe Deutscher, DO  I have reviewed the above documentation for accuracy and completeness, and I agree with the above. -  Briscoe Deutscher, DO, MS, FAAFP, DABOM - Family and Bariatric Medicine.

## 2021-10-14 ENCOUNTER — Other Ambulatory Visit: Payer: Self-pay | Admitting: Internal Medicine

## 2021-10-14 DIAGNOSIS — I10 Essential (primary) hypertension: Secondary | ICD-10-CM

## 2021-10-23 ENCOUNTER — Encounter: Payer: Self-pay | Admitting: Family Medicine

## 2021-11-02 ENCOUNTER — Encounter (INDEPENDENT_AMBULATORY_CARE_PROVIDER_SITE_OTHER): Payer: Self-pay | Admitting: Family Medicine

## 2021-11-02 ENCOUNTER — Other Ambulatory Visit: Payer: Self-pay

## 2021-11-02 ENCOUNTER — Ambulatory Visit (INDEPENDENT_AMBULATORY_CARE_PROVIDER_SITE_OTHER): Payer: BC Managed Care – PPO | Admitting: Family Medicine

## 2021-11-02 VITALS — BP 93/61 | HR 105 | Temp 97.9°F | Ht 78.0 in | Wt 393.0 lb

## 2021-11-02 DIAGNOSIS — E1159 Type 2 diabetes mellitus with other circulatory complications: Secondary | ICD-10-CM

## 2021-11-02 DIAGNOSIS — E785 Hyperlipidemia, unspecified: Secondary | ICD-10-CM

## 2021-11-02 DIAGNOSIS — E1169 Type 2 diabetes mellitus with other specified complication: Secondary | ICD-10-CM

## 2021-11-02 DIAGNOSIS — E66813 Obesity, class 3: Secondary | ICD-10-CM

## 2021-11-02 DIAGNOSIS — I152 Hypertension secondary to endocrine disorders: Secondary | ICD-10-CM

## 2021-11-02 DIAGNOSIS — Z8546 Personal history of malignant neoplasm of prostate: Secondary | ICD-10-CM

## 2021-11-02 DIAGNOSIS — E538 Deficiency of other specified B group vitamins: Secondary | ICD-10-CM

## 2021-11-02 DIAGNOSIS — E119 Type 2 diabetes mellitus without complications: Secondary | ICD-10-CM | POA: Insufficient documentation

## 2021-11-02 DIAGNOSIS — Z6841 Body Mass Index (BMI) 40.0 and over, adult: Secondary | ICD-10-CM

## 2021-11-02 NOTE — Progress Notes (Signed)
Chief Complaint:   OBESITY Dean [name] is here to discuss his progress with his obesity treatment plan along with follow-up of his obesity related diagnoses. See Medical Weight Management Flowsheet for complete bioelectrical impedance results.  Today's visit was #: 6 Starting weight: 419 lbs Starting date: 06/30/2021 Weight change since last visit: 5 lbs Total lbs lost to date: 26 lbs Total weight loss percentage to date: -6.21%  Nutrition Plan: Category 3 Meal plan for 60-70% of the time.  Activity: Increased activity. Anti-obesity medications: Mounjaro 12.5 mg subcutaneously weekly. Reported side effects: None.  Interim History: Home BP 125/83.  He is already taking 1/2 Benicar-HCTZ as discussed at last visit, which helped with orthostatic symptoms.  He says he has increased his water intake.   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  He is working 80+ hours per week.  Assessment/Plan:   1. Type 2 diabetes mellitus with other specified complication, without long-term current use of insulin (HCC) Diabetes Mellitus: Not at goal. Medication: Mounjaro 12.5 mg subcutaneously weekly. Issues reviewed: blood sugar goals, complications of diabetes mellitus, hypoglycemia prevention and treatment, exercise, and nutrition.  Plan:  Increase Mounjaro to 15 mg subcutaneously weekly.  The patient will continue to focus on protein-rich, low simple carbohydrate foods. We reviewed the importance of hydration, regular exercise for stress reduction, and restorative sleep.   Lab Results  Component Value Date   HGBA1C 8.3 (H) 04/09/2021   HGBA1C 5.8 (A) 01/15/2021   HGBA1C 6.1 (A) 04/02/2020   Lab Results  Component Value Date   LDLCALC 125 (H) 01/15/2021   CREATININE 1.23 04/13/2021   2. Hyperlipidemia associated with type 2 diabetes mellitus ([place]) Course: Not at goal. Lipid-lowering medications: Lipitor 80 mg daily.   Plan:  Continue Lipitor.  Dietary changes: Increase soluble  fiber, decrease simple carbohydrates, decrease saturated fat. Exercise changes: Moderate to vigorous-intensity aerobic activity 150 minutes per week or as tolerated. We will continue to monitor along with PCP/specialists as it pertains to his weight loss journey.  Lab Results  Component Value Date   CHOL 207 (H) 01/15/2021   HDL 54 01/15/2021   LDLCALC 125 (H) 01/15/2021   TRIG 158 (H) 01/15/2021   CHOLHDL 3.8 01/15/2021   Lab Results  Component Value Date   ALT 35 04/09/2021   AST 18 04/09/2021   ALKPHOS 107 04/09/2021   BILITOT 0.4 04/09/2021   The 10-year ASCVD risk score (Arnett DK, et al., 2019) is: 8.9%   Values used to calculate the score:     Age: 60 years     Sex: Male     Is Non-Hispanic African American: No     Diabetic: Yes     Tobacco smoker: No     Systolic Blood Pressure: 93 mmHg     Is BP treated: Yes     HDL Cholesterol: 43 mg/dL     Total Cholesterol: 156 mg/dL  3. Hypertension associated with diabetes ([place]) Medications: Benicar 20-12.5 (1/2 tablet) daily.   Plan:  Continue Benicar 1/2 tablet daily.  Avoid buying foods that are: processed, frozen, or prepackaged to avoid excess salt. We will watch for signs of hypotension as he continues lifestyle modifications.  BP Readings from Last 3 Encounters:  11/02/21 93/61  10/05/21 109/64  09/07/21 104/68   Lab Results  Component Value Date   CREATININE 1.23 04/13/2021   4. B12 deficiency Supplementation: None.   Plan:  Recommend OTC vitamin B12 supplement.    5.  History of prostate cancer Will check PSA today, as per below.  - PSA, total and free  6. Obesity BMI today is 45  Course: Dean [name] is currently in the action stage of change. As such, his goal is to continue with weight loss efforts.   Nutrition goals: He has agreed to the Category 3 Plan.   Exercise goals:  As is.  Behavioral modification strategies: increasing lean protein intake, decreasing simple carbohydrates, increasing vegetables,  and increasing water intake.  Dean [name] has agreed to follow-up with our clinic in 4 weeks. He was informed of the importance of frequent follow-up visits to maximize his success with intensive lifestyle modifications for his multiple health conditions.   Objective:   Blood pressure 93/61, pulse (!) 105, temperature 97.9 F (36.6 C), temperature source Oral, height 6\' 6"  (1.981 m), weight (!) 393 lb (178.3 kg), SpO2 97 %. Body mass index is 45.42 kg/m.  General: Cooperative, alert, well developed, in no acute distress. HEENT: Conjunctivae and lids unremarkable. Cardiovascular: Regular rhythm.  Lungs: Normal work of breathing. Neurologic: No focal deficits.   Lab Results  Component Value Date   CREATININE 1.23 04/13/2021   BUN 22 04/13/2021   NA 136 04/13/2021   K 4.5 04/13/2021   CL 96 04/13/2021   CO2 26 04/13/2021   Lab Results  Component Value Date   ALT 35 04/09/2021   AST 18 04/09/2021   ALKPHOS 107 04/09/2021   BILITOT 0.4 04/09/2021   Lab Results  Component Value Date   HGBA1C 8.3 (H) 04/09/2021   HGBA1C 5.8 (A) 01/15/2021   HGBA1C 6.1 (A) 04/02/2020   HGBA1C 6.0 (A) 07/04/2019   HGBA1C 5.6 12/28/2018   Lab Results  Component Value Date   TSH 2.260 04/09/2021   Lab Results  Component Value Date   CHOL 207 (H) 01/15/2021   HDL 54 01/15/2021   LDLCALC 125 (H) 01/15/2021   TRIG 158 (H) 01/15/2021   CHOLHDL 3.8 01/15/2021   Lab Results  Component Value Date   WBC 5.9 04/09/2021   HGB 12.5 (L) 04/09/2021   HCT 38.4 04/09/2021   MCV 93 04/09/2021   PLT 210 04/09/2021   Attestation Statements:   Reviewed by clinician on day of visit: allergies, medications, problem list, medical history, surgical history, family history, social history, and previous encounter notes.  I, Water quality scientist, CMA, am acting as transcriptionist for Briscoe Deutscher, DO  I have reviewed the above documentation for accuracy and completeness, and I agree with the above. -  Briscoe Deutscher, DO, MS, FAAFP, DABOM - Family and Bariatric Medicine.

## 2021-11-05 ENCOUNTER — Encounter: Payer: Self-pay | Admitting: Family Medicine

## 2021-11-06 LAB — PSA, TOTAL AND FREE
PSA, Free: <0.02 ng/mL
Prostate Specific Ag, Serum: <0.1 ng/mL (ref 0.0–4.0)

## 2021-11-06 LAB — LIPID PANEL WITH LDL/HDL RATIO
Cholesterol, Total: 140 mg/dL (ref 100–199)
HDL: 48 mg/dL (ref >39)
LDL Chol Calc (NIH): 69 mg/dL (ref 0–99)
LDL/HDL Ratio: 1.4 ratio (ref 0.0–3.6)
Triglycerides: 131 mg/dL (ref 0–149)
VLDL Cholesterol Cal: 23 mg/dL (ref 5–40)

## 2021-11-06 LAB — COMPREHENSIVE METABOLIC PANEL
ALT: 21 IU/L (ref 0–44)
AST: 16 IU/L (ref 0–40)
Albumin/Globulin Ratio: 2.3 — ABNORMAL HIGH (ref 1.2–2.2)
Albumin: 4.2 g/dL (ref 3.8–4.9)
Alkaline Phosphatase: 96 IU/L (ref 44–121)
BUN/Creatinine Ratio: 18 (ref 9–20)
BUN: 19 mg/dL (ref 6–24)
Bilirubin Total: 0.4 mg/dL (ref 0.0–1.2)
CO2: 25 mmol/L (ref 20–29)
Calcium: 8.9 mg/dL (ref 8.7–10.2)
Chloride: 101 mmol/L (ref 96–106)
Creatinine, Ser: 1.08 mg/dL (ref 0.76–1.27)
Globulin, Total: 1.8 g/dL (ref 1.5–4.5)
Glucose: 98 mg/dL (ref 70–99)
Potassium: 4.1 mmol/L (ref 3.5–5.2)
Sodium: 140 mmol/L (ref 134–144)
Total Protein: 6 g/dL (ref 6.0–8.5)
eGFR: 79 mL/min/{1.73_m2} (ref >59)

## 2021-11-06 LAB — HEMOGLOBIN A1C
Est. average glucose Bld gHb Est-mCnc: 105 mg/dL
Hgb A1c MFr Bld: 5.3 % (ref 4.8–5.6)

## 2021-11-06 LAB — VITAMIN D 25 HYDROXY (VIT D DEFICIENCY, FRACTURES): Vit D, 25-Hydroxy: 28.8 ng/mL — ABNORMAL LOW (ref 30.0–100.0)

## 2021-11-06 LAB — VITAMIN B12: Vitamin B-12: 1005 pg/mL (ref 232–1245)

## 2021-11-09 ENCOUNTER — Encounter (INDEPENDENT_AMBULATORY_CARE_PROVIDER_SITE_OTHER): Payer: Self-pay | Admitting: Family Medicine

## 2021-11-09 DIAGNOSIS — E1169 Type 2 diabetes mellitus with other specified complication: Secondary | ICD-10-CM

## 2021-11-09 MED ORDER — TIRZEPATIDE 15 MG/0.5ML ~~LOC~~ SOAJ: 15.0000 mg | SUBCUTANEOUS | 0 refills | Status: DC

## 2021-11-30 ENCOUNTER — Encounter (INDEPENDENT_AMBULATORY_CARE_PROVIDER_SITE_OTHER): Payer: Self-pay | Admitting: Family Medicine

## 2021-11-30 ENCOUNTER — Ambulatory Visit (INDEPENDENT_AMBULATORY_CARE_PROVIDER_SITE_OTHER): Payer: BC Managed Care – PPO | Admitting: Family Medicine

## 2021-11-30 ENCOUNTER — Other Ambulatory Visit: Payer: Self-pay

## 2021-11-30 VITALS — BP 103/67 | HR 97 | Temp 97.6°F | Ht 78.0 in | Wt 394.0 lb

## 2021-11-30 DIAGNOSIS — E1169 Type 2 diabetes mellitus with other specified complication: Secondary | ICD-10-CM

## 2021-11-30 DIAGNOSIS — R632 Polyphagia: Secondary | ICD-10-CM | POA: Diagnosis not present

## 2021-11-30 DIAGNOSIS — E559 Vitamin D deficiency, unspecified: Secondary | ICD-10-CM | POA: Diagnosis not present

## 2021-11-30 DIAGNOSIS — E1159 Type 2 diabetes mellitus with other circulatory complications: Secondary | ICD-10-CM | POA: Diagnosis not present

## 2021-11-30 DIAGNOSIS — E785 Hyperlipidemia, unspecified: Secondary | ICD-10-CM

## 2021-11-30 DIAGNOSIS — I152 Hypertension secondary to endocrine disorders: Secondary | ICD-10-CM

## 2021-11-30 DIAGNOSIS — Z6841 Body Mass Index (BMI) 40.0 and over, adult: Secondary | ICD-10-CM

## 2021-11-30 MED ORDER — ATORVASTATIN CALCIUM 80 MG PO TABS: 80.0000 mg | ORAL_TABLET | ORAL | 3 refills | Status: DC

## 2021-11-30 MED ORDER — TIRZEPATIDE 15 MG/0.5ML ~~LOC~~ SOAJ: 15.0000 mg | SUBCUTANEOUS | 0 refills | Status: DC

## 2021-11-30 MED ORDER — PHENTERMINE HCL 15 MG PO CAPS
ORAL_CAPSULE | ORAL | 0 refills | Status: DC
Start: 2021-11-30 — End: 2022-01-07

## 2021-11-30 MED ORDER — OLMESARTAN MEDOXOMIL-HCTZ 20-12.5 MG PO TABS: ORAL_TABLET | ORAL | 1 refills | Status: DC

## 2021-11-30 NOTE — Progress Notes (Signed)
Chief Complaint:   OBESITY Dean [name] is here to discuss his progress with his obesity treatment plan along with follow-up of his obesity related diagnoses. See Medical Weight Management Flowsheet for complete bioelectrical impedance results.  Today's visit was #: 7 Starting weight: 419 lbs Starting date: 06/30/2021 Weight change since last visit: +1 lb Total lbs lost to date: 24 lbs Total weight loss percentage to date: -5.97%  Nutrition Plan: Category 3 Plan for 40% of the time.  Activity: Walking 1.5 miles 3-4 times per week. Anti-obesity medications: Mounjaro 15 mg subcutaneously weekly. Reported side effects: None.  Interim History: Dean [name] says he has already started vitamin D.  We reviewed his labs today.  They look great!  Assessment/Plan:   1. Polyphagia Not at goal. Current treatment: None.    Plan: Start phentermine 15 mg in the morning and at noon.  He will continue to focus on protein-rich, low simple carbohydrate foods. We reviewed the importance of hydration, regular exercise for stress reduction, and restorative sleep.  - Start phentermine 15 MG capsule; One po each morning and afternoon.  Dispense: 60 capsule; Refill: 0  We reviewed potential side effects including insomnia, dry mouth, increased heart rate and blood pressure, and increased anxiety. We reviewed reducing caffeine consumption while taking phentermine, especially if the patient is experiencing side effects. Alternative treatment options were discussed. All questions were answered, and the patient wishes to move forward with this medication.   I have consulted the  Controlled Substances Registry for this patient, and feel the risk/benefit ratio today is favorable for proceeding with this prescription for a controlled substance. The patient understands monitoring parameters and red flags.   2. Vitamin D deficiency Not at goal.   Plan: Continue current OTC vitamin D supplementation.  Follow-up for  routine testing of Vitamin D, at least 2-3 times per year to avoid over-replacement.  Lab Results  Component Value Date   VD25OH 28.8 (L) 11/02/2021    3. Type 2 diabetes mellitus with other specified complication, without long-term current use of insulin (HCC) Diabetes Mellitus: At goal. Medication: Mounjaro 15 mg subcutaneously weekly. Issues reviewed: blood sugar goals, complications of diabetes mellitus, hypoglycemia prevention and treatment, exercise, and nutrition.  Plan:  Continue Mounjaro 15 mg subcutaneously weekly.  Will refill today. The patient will continue to focus on protein-rich, low simple carbohydrate foods. We reviewed the importance of hydration, regular exercise for stress reduction, and restorative sleep.   Lab Results  Component Value Date   HGBA1C 5.3 11/02/2021   HGBA1C 8.3 (H) 04/09/2021   HGBA1C 5.8 (A) 01/15/2021   Lab Results  Component Value Date   LDLCALC 69 11/02/2021   CREATININE 1.08 11/02/2021   - Refill tirzepatide (MOUNJARO) 15 MG/0.5ML Pen; Inject 15 mg into the skin once a week.  Dispense: 2 mL; Refill: 0  4. Hyperlipidemia associated with type 2 diabetes mellitus ([place]) Course: Controlled. Lipid-lowering medications: Lipitor 80 mg daily.   Plan: Continue Lipitor.  Will refill today, as per below.  Dietary changes: Increase soluble fiber, decrease simple carbohydrates, decrease saturated fat. Exercise changes: Moderate to vigorous-intensity aerobic activity 150 minutes per week or as tolerated. We will continue to monitor along with PCP/specialists as it pertains to his weight loss journey.  - Refill atorvastatin (LIPITOR) 80 MG tablet; Take 1 tablet (80 mg total) by mouth every other day.  Dispense: 45 tablet; Refill: 3  Lab Results  Component Value Date   CHOL 140 11/02/2021   HDL 48  11/02/2021   LDLCALC 69 11/02/2021   TRIG 131 11/02/2021   CHOLHDL 3.8 01/15/2021   Lab Results  Component Value Date   ALT 21 11/02/2021   AST 16  11/02/2021   ALKPHOS 96 11/02/2021   BILITOT 0.4 11/02/2021   The 10-year ASCVD risk score (Arnett DK, et al., 2019) is: 8.8%   Values used to calculate the score:     Age: 60 years     Sex: Male     Is Non-Hispanic African American: No     Diabetic: Yes     Tobacco smoker: No     Systolic Blood Pressure: 037 mmHg     Is BP treated: Yes     HDL Cholesterol: 48 mg/dL     Total Cholesterol: 140 mg/dL  5. Hypertension associated with diabetes ([place]) At goal. Medications: Benicar HCT 20-12.5 mg daily, Lasix 20 mg daily.   Plan: Avoid buying foods that are: processed, frozen, or prepackaged to avoid excess salt. We will watch for signs of hypotension as he continues lifestyle modifications.  - Refill olmesartan-hydrochlorothiazide (BENICAR HCT) 20-12.5 MG tablet; Taking 0.5 tab daily  Dispense: 45 tablet; Refill: 1  BP Readings from Last 3 Encounters:  11/30/21 103/67  11/02/21 93/61  10/05/21 109/64   Lab Results  Component Value Date   CREATININE 1.08 11/02/2021   8. Obesity BMI today is 45.5  Course: Dean [name] is currently in the action stage of change. As such, his goal is to continue with weight loss efforts.   Nutrition goals: He has agreed to the Category 3 Plan.   Exercise goals:  As is.  Behavioral modification strategies: increasing lean protein intake, decreasing simple carbohydrates, increasing vegetables, and increasing water intake.  Dean [name] has agreed to follow-up with our clinic in 4 weeks. He was informed of the importance of frequent follow-up visits to maximize his success with intensive lifestyle modifications for his multiple health conditions.   Objective:   Blood pressure 103/67, pulse 97, temperature 97.6 F (36.4 C), temperature source Oral, height 6\' 6"  (1.981 m), weight (!) 394 lb (178.7 kg), SpO2 96 %. Body mass index is 45.53 kg/m.  General: Cooperative, alert, well developed, in no acute distress. HEENT: Conjunctivae and lids  unremarkable. Cardiovascular: Regular rhythm.  Lungs: Normal work of breathing. Neurologic: No focal deficits.   Lab Results  Component Value Date   CREATININE 1.08 11/02/2021   BUN 19 11/02/2021   NA 140 11/02/2021   K 4.1 11/02/2021   CL 101 11/02/2021   CO2 25 11/02/2021   Lab Results  Component Value Date   ALT 21 11/02/2021   AST 16 11/02/2021   ALKPHOS 96 11/02/2021   BILITOT 0.4 11/02/2021   Lab Results  Component Value Date   HGBA1C 5.3 11/02/2021   HGBA1C 8.3 (H) 04/09/2021   HGBA1C 5.8 (A) 01/15/2021   HGBA1C 6.1 (A) 04/02/2020   HGBA1C 6.0 (A) 07/04/2019   Lab Results  Component Value Date   TSH 2.260 04/09/2021   Lab Results  Component Value Date   CHOL 140 11/02/2021   HDL 48 11/02/2021   LDLCALC 69 11/02/2021   TRIG 131 11/02/2021   CHOLHDL 3.8 01/15/2021   Lab Results  Component Value Date   VD25OH 28.8 (L) 11/02/2021   Lab Results  Component Value Date   WBC 5.9 04/09/2021   HGB 12.5 (L) 04/09/2021   HCT 38.4 04/09/2021   MCV 93 04/09/2021   PLT 210 04/09/2021   Attestation Statements:  Reviewed by clinician on day of visit: allergies, medications, problem list, medical history, surgical history, family history, social history, and previous encounter notes.  I, Water quality scientist, CMA, am acting as transcriptionist for Briscoe Deutscher, DO  I have reviewed the above documentation for accuracy and completeness, and I agree with the above. -  Briscoe Deutscher, DO, MS, FAAFP, DABOM - Family and Bariatric Medicine.

## 2022-01-07 ENCOUNTER — Other Ambulatory Visit: Payer: Self-pay

## 2022-01-07 ENCOUNTER — Encounter (INDEPENDENT_AMBULATORY_CARE_PROVIDER_SITE_OTHER): Payer: Self-pay | Admitting: Family Medicine

## 2022-01-07 ENCOUNTER — Ambulatory Visit (INDEPENDENT_AMBULATORY_CARE_PROVIDER_SITE_OTHER): Payer: BC Managed Care – PPO | Admitting: Family Medicine

## 2022-01-07 ENCOUNTER — Ambulatory Visit (INDEPENDENT_AMBULATORY_CARE_PROVIDER_SITE_OTHER): Payer: BC Managed Care – PPO | Admitting: Physician Assistant

## 2022-01-07 VITALS — BP 119/80 | HR 100 | Temp 97.5°F | Ht 78.0 in | Wt 393.0 lb

## 2022-01-07 DIAGNOSIS — R600 Localized edema: Secondary | ICD-10-CM | POA: Diagnosis not present

## 2022-01-07 DIAGNOSIS — R632 Polyphagia: Secondary | ICD-10-CM | POA: Diagnosis not present

## 2022-01-07 DIAGNOSIS — Z6841 Body Mass Index (BMI) 40.0 and over, adult: Secondary | ICD-10-CM

## 2022-01-07 DIAGNOSIS — I10 Essential (primary) hypertension: Secondary | ICD-10-CM

## 2022-01-07 DIAGNOSIS — E669 Obesity, unspecified: Secondary | ICD-10-CM

## 2022-01-07 DIAGNOSIS — E1169 Type 2 diabetes mellitus with other specified complication: Secondary | ICD-10-CM | POA: Diagnosis not present

## 2022-01-07 MED ORDER — PHENTERMINE HCL 15 MG PO CAPS: ORAL_CAPSULE | ORAL | 0 refills | Status: DC

## 2022-01-07 MED ORDER — FUROSEMIDE 20 MG PO TABS: 20.0000 mg | ORAL_TABLET | Freq: Every day | ORAL | 1 refills | Status: DC

## 2022-01-07 MED ORDER — TIRZEPATIDE 15 MG/0.5ML ~~LOC~~ SOAJ: 15.0000 mg | SUBCUTANEOUS | 0 refills | Status: DC

## 2022-01-12 NOTE — Progress Notes (Signed)
Chief Complaint:   OBESITY Dean [name] is here to discuss his progress with his obesity treatment plan along with follow-up of his obesity related diagnoses. See Medical Weight Management Flowsheet for complete bioelectrical impedance results.  Today's visit was #: 8 Starting weight: 419 lbs Starting date: 06/30/2021 Weight change since last visit: 1 lb Total lbs lost to date: 26 lbs Total weight loss percentage to date: -6.21%  Nutrition Plan: Category 3 Plan.  Activity: Walking for 20 minutes 4-5 times per week.  Anti-obesity medications: Mounjaro 15 mg subcutaneously weekly and phentermine 15 mg twice daily. Reported side effects: None.  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  He says he enjoyed the holidays.  Phentermine helps but he says he often forgets the afternoon dose.  Assessment/Plan:   1. Bilateral lower extremity edema He is taking Lasix 20 mg daily for edema. The current medical regimen is effective;  continue present plan and medications.  - Refill furosemide (LASIX) 20 MG tablet; Take 1 tablet (20 mg total) by mouth daily.  Dispense: 90 tablet; Refill: 1  2. Polyphagia Controlled. Current treatment: phentermine 15 mg twice daily.    Plan:  Continue phentermine 15 mg twice daily.  Will refill today, as per below. He will continue to focus on protein-rich, low simple carbohydrate foods. We reviewed the importance of hydration, regular exercise for stress reduction, and restorative sleep.  - Refill phentermine 15 MG capsule; One po each morning and afternoon.  Dispense: 60 capsule; Refill: 0  Having again reminded the patient of the "off label" use of Phentermine beyond three consecutive months, and again discussing the risks, benefits, contraindications, and limitations of it's use; given it's role in the successful treatment of obesity thus far and lack of adverse effect, patient has expressed desire and given informed verbal consent to continue  use.   I have consulted the [place] Controlled Substances Registry for this patient, and feel the risk/benefit ratio today is favorable for proceeding with this prescription for a controlled substance. The patient understands monitoring parameters and red flags.   3. Essential hypertension At goal. Medications: olmesartan-HCTZ 20-12.5 mg daily.   Plan: Avoid buying foods that are: processed, frozen, or prepackaged to avoid excess salt. We will watch for signs of hypotension as he continues lifestyle modifications.  BP Readings from Last 3 Encounters:  01/07/22 119/80  11/30/21 103/67  11/02/21 93/61   Lab Results  Component Value Date   CREATININE 1.08 11/02/2021   4. Type 2 diabetes mellitus with other specified complication, without long-term current use of insulin (HCC) Diabetes Mellitus: At goal. Medication: Mounjaro 15 mg subcutaneously weekly. Issues reviewed: blood sugar goals, complications of diabetes mellitus, hypoglycemia prevention and treatment, exercise, and nutrition.  Plan:  Continue Mounjaro subcutaneously weekly.  Will refill today. The patient will continue to focus on protein-rich, low simple carbohydrate foods. We reviewed the importance of hydration, regular exercise for stress reduction, and restorative sleep.   Lab Results  Component Value Date   HGBA1C 5.3 11/02/2021   HGBA1C 8.3 (H) 04/09/2021   HGBA1C 5.8 (A) 01/15/2021   Lab Results  Component Value Date   LDLCALC 69 11/02/2021   CREATININE 1.08 11/02/2021   - Refill tirzepatide (MOUNJARO) 15 MG/0.5ML Pen; Inject 15 mg into the skin once a week.  Dispense: 2 mL; Refill: 0  5. Obesity BMI today is 45.4  Course: Dean [name] is currently in the action stage of change. As such, his goal is to continue with  weight loss efforts.   Nutrition goals: He has agreed to the Category 3 Plan.   Exercise goals:  Increase strength training.  Behavioral modification strategies: increasing lean protein intake, decreasing  simple carbohydrates, increasing vegetables, and increasing water intake.  Dean [name] has agreed to follow-up with our clinic in 4 weeks. He was informed of the importance of frequent follow-up visits to maximize his success with intensive lifestyle modifications for his multiple health conditions.   Objective:   Blood pressure 119/80, pulse 100, temperature (!) 97.5 F (36.4 C), temperature source Oral, height 6\' 6"  (1.981 m), weight (!) 393 lb (178.3 kg), SpO2 100 %. Body mass index is 45.42 kg/m.  General: Cooperative, alert, well developed, in no acute distress. HEENT: Conjunctivae and lids unremarkable. Cardiovascular: Regular rhythm.  Lungs: Normal work of breathing. Neurologic: No focal deficits.   Lab Results  Component Value Date   CREATININE 1.08 11/02/2021   BUN 19 11/02/2021   NA 140 11/02/2021   K 4.1 11/02/2021   CL 101 11/02/2021   CO2 25 11/02/2021   Lab Results  Component Value Date   ALT 21 11/02/2021   AST 16 11/02/2021   ALKPHOS 96 11/02/2021   BILITOT 0.4 11/02/2021   Lab Results  Component Value Date   HGBA1C 5.3 11/02/2021   HGBA1C 8.3 (H) 04/09/2021   HGBA1C 5.8 (A) 01/15/2021   HGBA1C 6.1 (A) 04/02/2020   HGBA1C 6.0 (A) 07/04/2019   Lab Results  Component Value Date   TSH 2.260 04/09/2021   Lab Results  Component Value Date   CHOL 140 11/02/2021   HDL 48 11/02/2021   LDLCALC 69 11/02/2021   TRIG 131 11/02/2021   CHOLHDL 3.8 01/15/2021   Lab Results  Component Value Date   VD25OH 28.8 (L) 11/02/2021   Lab Results  Component Value Date   WBC 5.9 04/09/2021   HGB 12.5 (L) 04/09/2021   HCT 38.4 04/09/2021   MCV 93 04/09/2021   PLT 210 04/09/2021   Attestation Statements:   Reviewed by clinician on day of visit: allergies, medications, problem list, medical history, surgical history, family history, social history, and previous encounter notes.  I, Water quality scientist, CMA, am acting as transcriptionist for Briscoe Deutscher, DO  I have  reviewed the above documentation for accuracy and completeness, and I agree with the above. -  Briscoe Deutscher, DO, MS, FAAFP, DABOM - Family and Bariatric Medicine.

## 2022-02-15 ENCOUNTER — Other Ambulatory Visit: Payer: Self-pay

## 2022-02-15 ENCOUNTER — Ambulatory Visit (INDEPENDENT_AMBULATORY_CARE_PROVIDER_SITE_OTHER): Payer: BC Managed Care – PPO | Admitting: Family Medicine

## 2022-02-15 ENCOUNTER — Encounter (INDEPENDENT_AMBULATORY_CARE_PROVIDER_SITE_OTHER): Payer: Self-pay | Admitting: Family Medicine

## 2022-02-15 VITALS — BP 110/73 | HR 96 | Temp 97.9°F | Ht 78.0 in | Wt 395.0 lb

## 2022-02-15 DIAGNOSIS — F5081 Binge eating disorder: Secondary | ICD-10-CM

## 2022-02-15 DIAGNOSIS — R632 Polyphagia: Secondary | ICD-10-CM | POA: Diagnosis not present

## 2022-02-15 DIAGNOSIS — Z6841 Body Mass Index (BMI) 40.0 and over, adult: Secondary | ICD-10-CM

## 2022-02-15 DIAGNOSIS — E1169 Type 2 diabetes mellitus with other specified complication: Secondary | ICD-10-CM | POA: Diagnosis not present

## 2022-02-15 DIAGNOSIS — E669 Obesity, unspecified: Secondary | ICD-10-CM

## 2022-02-15 DIAGNOSIS — Z72820 Sleep deprivation: Secondary | ICD-10-CM

## 2022-02-15 DIAGNOSIS — Z7985 Long-term (current) use of injectable non-insulin antidiabetic drugs: Secondary | ICD-10-CM

## 2022-02-15 MED ORDER — TIRZEPATIDE 15 MG/0.5ML ~~LOC~~ SOAJ: 15.0000 mg | SUBCUTANEOUS | 3 refills | Status: DC

## 2022-02-15 MED ORDER — VYVANSE 60 MG PO CHEW: 60.0000 mg | CHEWABLE_TABLET | ORAL | 0 refills | Status: DC

## 2022-02-16 NOTE — Progress Notes (Signed)
Chief Complaint:   OBESITY Dean [name] is here to discuss his progress with his obesity treatment plan along with follow-up of his obesity related diagnoses. See Medical Weight Management Flowsheet for complete bioelectrical impedance results.  Today's visit was #: 9 Starting weight: 419 lbs Starting date: 06/30/2021 Weight change since last visit: +2 lbs Total lbs lost to date: 24 lbs Total weight loss percentage to date: -5.73%  Nutrition Plan: Category 3 Plan for 50% of the time.  Activity: Walking for 20 minutes 2-3 times per week.  Anti-obesity medications: Mounjaro 15 mg subcutaneously weekly and phentermine 15 mg twice daily. Reported side effects: None.  Interim History: Dean [name] endorses polyphagia/cravings.  He says it has been the off season, so he is getting less exercise.  Season training starts this week.  Assessment/Plan:   1. Polyphagia Not at goal. Current treatment: Mounjaro 15 mg subcutaneously weekly, phentermine 15 mg twice daily.    Plan: Stop phentermine.  Continue Mounjaro 15 mg subcutaneously weekly.  He will continue to focus on protein-rich, low simple carbohydrate foods. We reviewed the importance of hydration, regular exercise for stress reduction, and restorative sleep.  2. Type 2 diabetes mellitus with other specified complication, without long-term current use of insulin (HCC) Diabetes Mellitus: At goal. Medication: Mounjaro 15 mg subcutaneously weekly. Issues reviewed: blood sugar goals, complications of diabetes mellitus, hypoglycemia prevention and treatment, exercise, and nutrition.  Plan: Continue Mounjaro 15 subcutaneously weekly.  The patient will continue to focus on protein-rich, low simple carbohydrate foods. We reviewed the importance of hydration, regular exercise for stress reduction, and restorative sleep.   Lab Results  Component Value Date   HGBA1C 5.3 11/02/2021   HGBA1C 8.3 (H) 04/09/2021   HGBA1C 5.8 (A) 01/15/2021   Lab Results   Component Value Date   LDLCALC 69 11/02/2021   CREATININE 1.08 11/02/2021   - Refill tirzepatide (MOUNJARO) 15 MG/0.5ML Pen; Inject 15 mg into the skin once a week.  Dispense: 6 mL; Refill: 3  3. Poor sleep Declines medication.  Gets ~5-6 hours of sleep/night.  4. Binge eating disorder Start Vyvanse 60 mg chew daily.  The goals for treatment of BED are to reduce eating binges and to achieve healthy eating habits. Because binge eating can correlate with negative emotions, treatment may also address any other mental-health issues, such as depression.  People who binge eat feel as if they don't have control over how much they eat and have feelings of guilt or self-loathing after a binge eating episode.  The FDA has approved Vyvanse as a treatment option for binge eating disorder. Vyvanse targets the brain's reward center by increasing the levels of dopamine and norepinephrine, the chemicals of the brain responsible for feelings of pleasure.  Mindful eating is the recommended nutritional approach to treating BED.   - Start Lisdexamfetamine Dimesylate (VYVANSE) 60 MG CHEW; Chew 60 mg by mouth every morning.  Dispense: 30 tablet; Refill: 0  I have consulted the [place] Controlled Substances Registry for this patient, and feel the risk/benefit ratio today is favorable for proceeding with this prescription for a controlled substance. The patient understands monitoring parameters and red flags.   5. Obesity BMI today is 45.7  Course: Dean [name] is currently in the action stage of change. As such, his goal is to continue with weight loss efforts.   Nutrition goals: He has agreed to the Category 3 Plan.   Exercise goals:  As is.  Behavioral modification strategies: increasing lean protein intake, decreasing simple  carbohydrates, and increasing vegetables.  Dean [name] has agreed to follow-up with our clinic in 4 weeks. He was informed of the importance of frequent follow-up visits to maximize his success  with intensive lifestyle modifications for his multiple health conditions.   Objective:   Blood pressure 110/73, pulse 96, temperature 97.9 F (36.6 C), temperature source Oral, height 6\' 6"  (1.981 m), weight (!) 395 lb (179.2 kg), SpO2 97 %. Body mass index is 45.65 kg/m.  General: Cooperative, alert, well developed, in no acute distress. HEENT: Conjunctivae and lids unremarkable. Cardiovascular: Regular rhythm.  Lungs: Normal work of breathing. Neurologic: No focal deficits.   Lab Results  Component Value Date   CREATININE 1.08 11/02/2021   BUN 19 11/02/2021   NA 140 11/02/2021   K 4.1 11/02/2021   CL 101 11/02/2021   CO2 25 11/02/2021   Lab Results  Component Value Date   ALT 21 11/02/2021   AST 16 11/02/2021   ALKPHOS 96 11/02/2021   BILITOT 0.4 11/02/2021   Lab Results  Component Value Date   HGBA1C 5.3 11/02/2021   HGBA1C 8.3 (H) 04/09/2021   HGBA1C 5.8 (A) 01/15/2021   HGBA1C 6.1 (A) 04/02/2020   HGBA1C 6.0 (A) 07/04/2019   Lab Results  Component Value Date   TSH 2.260 04/09/2021   Lab Results  Component Value Date   CHOL 140 11/02/2021   HDL 48 11/02/2021   LDLCALC 69 11/02/2021   TRIG 131 11/02/2021   CHOLHDL 3.8 01/15/2021   Lab Results  Component Value Date   VD25OH 28.8 (L) 11/02/2021   Lab Results  Component Value Date   WBC 5.9 04/09/2021   HGB 12.5 (L) 04/09/2021   HCT 38.4 04/09/2021   MCV 93 04/09/2021   PLT 210 04/09/2021   Attestation Statements:   Reviewed by clinician on day of visit: allergies, medications, problem list, medical history, surgical history, family history, social history, and previous encounter notes.  I, Water quality scientist, CMA, am acting as transcriptionist for Briscoe Deutscher, DO  I have reviewed the above documentation for accuracy and completeness, and I agree with the above. -  Briscoe Deutscher, DO, MS, FAAFP, DABOM - Family and Bariatric Medicine.

## 2022-03-16 ENCOUNTER — Ambulatory Visit (INDEPENDENT_AMBULATORY_CARE_PROVIDER_SITE_OTHER): Payer: BC Managed Care – PPO | Admitting: Family Medicine

## 2022-03-16 ENCOUNTER — Encounter (INDEPENDENT_AMBULATORY_CARE_PROVIDER_SITE_OTHER): Payer: Self-pay | Admitting: Family Medicine

## 2022-03-16 ENCOUNTER — Other Ambulatory Visit: Payer: Self-pay

## 2022-03-16 VITALS — BP 112/70 | HR 94 | Temp 97.7°F | Ht 78.0 in | Wt 388.0 lb

## 2022-03-16 DIAGNOSIS — Z7985 Long-term (current) use of injectable non-insulin antidiabetic drugs: Secondary | ICD-10-CM

## 2022-03-16 DIAGNOSIS — I10 Essential (primary) hypertension: Secondary | ICD-10-CM

## 2022-03-16 DIAGNOSIS — E669 Obesity, unspecified: Secondary | ICD-10-CM | POA: Diagnosis not present

## 2022-03-16 DIAGNOSIS — Z6841 Body Mass Index (BMI) 40.0 and over, adult: Secondary | ICD-10-CM

## 2022-03-16 DIAGNOSIS — F5081 Binge eating disorder: Secondary | ICD-10-CM

## 2022-03-16 DIAGNOSIS — E1169 Type 2 diabetes mellitus with other specified complication: Secondary | ICD-10-CM

## 2022-03-16 MED ORDER — VYVANSE 60 MG PO CHEW: 60.0000 mg | CHEWABLE_TABLET | ORAL | 0 refills | Status: DC

## 2022-03-16 MED ORDER — TIRZEPATIDE 15 MG/0.5ML ~~LOC~~ SOAJ: 15.0000 mg | SUBCUTANEOUS | 3 refills | Status: DC

## 2022-03-17 NOTE — Progress Notes (Signed)
Chief Complaint:   OBESITY Dean [name] is here to discuss his progress with his obesity treatment plan along with follow-up of his obesity related diagnoses.   Today's visit was #: 10 Starting weight: 419 lbs Starting date: 06/30/2021 Today's weight: 388 lbs Today's date: 03/16/2022 Weight change since last visit: -7 lbs Total lbs lost to date: 31 lbs Body mass index is 44.84 kg/m.  Total weight loss percentage to date: -7.4%  Current Meal Plan: the Category 3 Plan for 60% of the time.  Current Exercise Plan: Walking for 20 minutes 2-3 times per week. Current Anti-Obesity Medications: Mounjaro 15 mg subcutaneously weekly. Side effects: None.  Interim History: Dean [name] reports that the Vyvanse is helping to decrease his binge eating. He is taking 30 mg twice daily with no side effects.  Assessment/Plan:   1. Type 2 diabetes mellitus with other specified complication, without long-term current use of insulin (HCC) Diabetes Mellitus: At goal. Medication: Mounjaro 15 mg subcutaneously weekly. Issues reviewed: blood sugar goals, complications of diabetes mellitus, hypoglycemia prevention and treatment, exercise, and nutrition.   Plan: The patient will continue to focus on protein-rich, low simple carbohydrate foods. We reviewed the importance of hydration, regular exercise for stress reduction, and restorative sleep. Refill Mounjaro, as per below.  Lab Results  Component Value Date   HGBA1C 5.3 11/02/2021   HGBA1C 8.3 (H) 04/09/2021   HGBA1C 5.8 (A) 01/15/2021   Lab Results  Component Value Date   LDLCALC 69 11/02/2021   CREATININE 1.08 11/02/2021   - Refill tirzepatide (MOUNJARO) 15 MG/0.5ML Pen; Inject 15 mg into the skin once a week.  Dispense: 6 mL; Refill: 3  2. Essential hypertension Controlled. Medications: Benicar HCT 20-12.5 mg daily.   Plan: Avoid buying foods that are: processed, frozen, or prepackaged to avoid excess salt. We will watch for signs of hypotension as  he continues lifestyle modifications.  BP Readings from Last 3 Encounters:  03/16/22 112/70  02/15/22 110/73  01/07/22 119/80   Lab Results  Component Value Date   CREATININE 1.08 11/02/2021   3. Binge eating disorder Continue Vyvanse 60 mg chew daily.   The goals for treatment of BED are to reduce eating binges and to achieve healthy eating habits. Because binge eating can correlate with negative emotions, treatment may also address any other mental-health issues, such as depression.   People who binge eat feel as if they don't have control over how much they eat and have feelings of guilt or self-loathing after a binge eating episode.  The FDA has approved Vyvanse as a treatment option for binge eating disorder. Vyvanse targets the brain's reward center by increasing the levels of dopamine and norepinephrine, the chemicals of the brain responsible for feelings of pleasure.  Mindful eating is the recommended nutritional approach to treating BED.   - Lisdexamfetamine Dimesylate (VYVANSE) 60 MG CHEW; Chew 60 mg by mouth every morning.  Dispense: 30 tablet; Refill: 0 - Lisdexamfetamine Dimesylate (VYVANSE) 60 MG CHEW; Chew 60 mg by mouth every morning.  Dispense: 30 tablet; Refill: 0 - Lisdexamfetamine Dimesylate (VYVANSE) 60 MG CHEW; Chew 60 mg by mouth every morning.  Dispense: 30 tablet; Refill: 0  I have consulted the [place] Controlled Substances Registry for this patient, and feel the risk/benefit ratio today is favorable for proceeding with this prescription for a controlled substance. The patient understands monitoring parameters and red flags.   4. Obesity BMI today is 44.9 Course: Dean [name] is currently in the action stage of change.  As such, his goal is to continue with weight loss efforts.   Nutrition goals: He has agreed to the Category 3 Plan.   Exercise goals: As is.  Behavioral modification strategies: increasing lean protein intake, decreasing simple carbohydrates, increasing  vegetables, increasing water intake, and decreasing liquid calories.  Dean [name] has agreed to follow-up with our clinic in 6 weeks. He was informed of the importance of frequent follow-up visits to maximize his success with intensive lifestyle modifications for his multiple health conditions.   Objective:   Blood pressure 112/70, pulse 94, temperature 97.7 F (36.5 C), temperature source Oral, height '6\' 6"'$  (1.981 m), weight (!) 388 lb (176 kg), SpO2 99 %. Body mass index is 44.84 kg/m.  General: Cooperative, alert, well developed, in no acute distress. HEENT: Conjunctivae and lids unremarkable. Cardiovascular: Regular rhythm.  Lungs: Normal work of breathing. Neurologic: No focal deficits.   Lab Results  Component Value Date   CREATININE 1.08 11/02/2021   BUN 19 11/02/2021   NA 140 11/02/2021   K 4.1 11/02/2021   CL 101 11/02/2021   CO2 25 11/02/2021   Lab Results  Component Value Date   ALT 21 11/02/2021   AST 16 11/02/2021   ALKPHOS 96 11/02/2021   BILITOT 0.4 11/02/2021   Lab Results  Component Value Date   HGBA1C 5.3 11/02/2021   HGBA1C 8.3 (H) 04/09/2021   HGBA1C 5.8 (A) 01/15/2021   HGBA1C 6.1 (A) 04/02/2020   HGBA1C 6.0 (A) 07/04/2019   Lab Results  Component Value Date   TSH 2.260 04/09/2021   Lab Results  Component Value Date   CHOL 140 11/02/2021   HDL 48 11/02/2021   LDLCALC 69 11/02/2021   TRIG 131 11/02/2021   CHOLHDL 3.8 01/15/2021   Lab Results  Component Value Date   VD25OH 28.8 (L) 11/02/2021   Lab Results  Component Value Date   WBC 5.9 04/09/2021   HGB 12.5 (L) 04/09/2021   HCT 38.4 04/09/2021   MCV 93 04/09/2021   PLT 210 04/09/2021   Attestation Statements:   Reviewed by clinician on day of visit: allergies, medications, problem list, medical history, surgical history, family history, social history, and previous encounter notes.  Leodis Binet Friedenbach, CMA, am acting as Location manager for [employer], DO.  I have reviewed  the above documentation for accuracy and completeness, and I agree with the above. -  Briscoe Deutscher, DO, MS, FAAFP, DABOM - Family and Bariatric Medicine.

## 2022-04-23 ENCOUNTER — Ambulatory Visit: Payer: BC Managed Care – PPO | Admitting: Physician Assistant

## 2022-04-23 ENCOUNTER — Encounter: Payer: Self-pay | Admitting: Physician Assistant

## 2022-04-23 VITALS — BP 130/90 | HR 84 | Ht 78.0 in | Wt 388.4 lb

## 2022-04-23 DIAGNOSIS — Z86711 Personal history of pulmonary embolism: Secondary | ICD-10-CM

## 2022-04-23 DIAGNOSIS — R350 Frequency of micturition: Secondary | ICD-10-CM | POA: Diagnosis not present

## 2022-04-23 DIAGNOSIS — R600 Localized edema: Secondary | ICD-10-CM

## 2022-04-23 DIAGNOSIS — R319 Hematuria, unspecified: Secondary | ICD-10-CM

## 2022-04-23 DIAGNOSIS — Z6841 Body Mass Index (BMI) 40.0 and over, adult: Secondary | ICD-10-CM

## 2022-04-23 DIAGNOSIS — Z91148 Patient's other noncompliance with medication regimen for other reason: Secondary | ICD-10-CM

## 2022-04-23 DIAGNOSIS — Z86718 Personal history of other venous thrombosis and embolism: Secondary | ICD-10-CM

## 2022-04-23 DIAGNOSIS — Z8546 Personal history of malignant neoplasm of prostate: Secondary | ICD-10-CM

## 2022-04-23 LAB — POCT URINALYSIS DIP (PROADVANTAGE DEVICE)
Bilirubin, UA: NEGATIVE
Blood, UA: NEGATIVE
Glucose, UA: NEGATIVE mg/dL
Ketones, POC UA: NEGATIVE mg/dL
Leukocytes, UA: NEGATIVE
Nitrite, UA: NEGATIVE
Protein Ur, POC: NEGATIVE mg/dL
Specific Gravity, Urine: 1.02
Urobilinogen, Ur: 0.2
pH, UA: 6 (ref 5.0–8.0)

## 2022-04-23 MED ORDER — RIVAROXABAN 20 MG PO TABS: 20.0000 mg | ORAL_TABLET | Freq: Every day | ORAL | 2 refills | Status: DC

## 2022-04-23 NOTE — Progress Notes (Signed)
? ?Acute Office Visit ? ?Subjective:  ? ? Patient ID: Dean [name], male    DOB: June 01, 1961, 61 y.o.   MRN: [id number] ? ?Chief Complaint  ?Patient presents with  ? Acute Visit  ?  Blood in urine and swelling in both feet  ? ? ?HPI ?Patient is in today for blood in urine intermittently since diagnosed with prostate cancer 2021; last weekend saw blood clots in his urine, but has not seen any today; took AZO and it seems better; reports that he has mentioned blood in his urine to his Urologist in the past and they recommended that he take OTC AZO; denies history of a "kidney stone";  Denies fever/chills, nausea/vomiting, diarrhea/constipation.  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 ? ?Outpatient Medications Prior to Visit  ?Medication Sig Dispense Refill  ? Blood Glucose Monitoring Suppl (ONETOUCH VERIO) w/Device KIT 1 each by Other route daily. 1 kit 0  ? Glucose Blood (BLOOD GLUCOSE TEST STRIPS) STRP Test 1-2 times a day. Pt uses one touch ultra meter 100 strip 2  ? Lancets (ONETOUCH ULTRASOFT) lancets Test once a day. Pt will use a one touch verio meter 100 each 1  ? Lisdexamfetamine Dimesylate (VYVANSE) 60 MG CHEW Chew 60 mg by mouth every morning. 30 tablet 0  ? Multiple Vitamin (MULTIVITAMIN) tablet Take 1 tablet by mouth daily.    ? olmesartan-hydrochlorothiazide (BENICAR HCT) 20-12.5 MG tablet Taking 0.5 tab daily 45 tablet 1  ? tamsulosin (FLOMAX) 0.4 MG CAPS capsule Take 0.4 mg by mouth at bedtime.    ? tirzepatide (MOUNJARO) 15 MG/0.5ML Pen Inject 15 mg into the skin once a week. 6 mL 3  ? atorvastatin (LIPITOR) 80 MG tablet Take 1 tablet (80 mg total) by  mouth every other day. 45 tablet 3  ? furosemide (LASIX) 20 MG tablet Take 1 tablet (20 mg total) by mouth daily. 90 tablet 1  ? Lisdexamfetamine Dimesylate (VYVANSE) 60 MG CHEW Chew 60 mg by mouth every morning. 30 tablet 0  ? Lisdexamfetamine Dimesylate (VYVANSE) 60 MG CHEW Chew 60 mg by mouth every morning. 30 tablet 0  ? XARELTO 20 MG TABS tablet TAKE 1 TABLET BY MOUTH DAILY WITH SUPPER. (Patient not taking: Reported on 04/23/2022) 30 tablet 2  ? ?No facility-administered medications prior to visit.  ? ? ?Allergies  ?Allergen Reactions  ? Penicillins Rash and Anaphylaxis  ? ? ?Review of Systems  ?Constitutional:  Negative for activity change, chills, fatigue and fever.  ?HENT:  Negative for congestion, ear pain, hearing loss and voice change.   ?Eyes:  Negative for pain and redness.  ?Respiratory:  Negative for cough and shortness of breath.   ?Cardiovascular:  Positive for leg swelling.  ?Gastrointestinal:  Negative for constipation, diarrhea, nausea and vomiting.  ?Endocrine: Negative for polyuria.  ?Genitourinary:  Positive for hematuria. Negative for flank pain and frequency.  ?Musculoskeletal:  Negative for joint swelling and neck pain.  ?Skin:  Negative for rash.  ?Neurological:  Negative for dizziness.  ?Hematological:  Does not bruise/bleed easily.  ?Psychiatric/Behavioral:  Negative for agitation and behavioral problems.   ? ?   ?  Objective:  ?  ?Physical Exam ?Vitals and nursing note reviewed.  ?Constitutional:   ?   General: He is not in acute distress. ?   Appearance: Normal appearance.  ?HENT:  ?   Head: Normocephalic and atraumatic.  ?   Right Ear: External ear normal.  ?   Left Ear: External ear normal.  ?   Nose: No congestion.  ?Eyes:  ?   Extraocular Movements: Extraocular movements intact.  ?   Conjunctiva/sclera: Conjunctivae normal.  ?   Pupils: Pupils are equal, round, and reactive to light.  ?Cardiovascular:  ?   Rate and Rhythm: Normal rate and regular rhythm.  ?   Pulses: Normal pulses.  ?    Heart sounds: Normal heart sounds.  ?Pulmonary:  ?   Effort: Pulmonary effort is normal.  ?   Breath sounds: Normal breath sounds. No wheezing.  ?Abdominal:  ?   General: Bowel sounds are normal.  ?   Palpations: Abdomen is soft.  ?Musculoskeletal:     ?   General: Normal range of motion.  ?   Cervical back: Normal range of motion and neck supple.  ?   Right lower leg: No edema.  ?   Left lower leg: No edema.  ?Skin: ?   General: Skin is warm and dry.  ?   Findings: No rash.  ?Neurological:  ?   Mental Status: He is alert and oriented to person, place, and time.  ?   Gait: Gait normal.  ?Psychiatric:     ?   Mood and Affect: Mood normal.     ?   Behavior: Behavior normal.  ? ? ?BP 130/90   Pulse 84   Ht 6' 6"  (1.981 m)   Wt (!) 388 lb 6.4 oz (176.2 kg)   SpO2 95%   BMI 44.88 kg/m?  ? ?BP Readings from Last 5 Encounters:  ?04/23/22 130/90  ?03/16/22 112/70  ?02/15/22 110/73  ?01/07/22 119/80  ?11/30/21 103/67  ? ? ? ?Wt Readings from Last 3 Encounters:  ?04/23/22 (!) 388 lb 6.4 oz (176.2 kg)  ?03/16/22 (!) 388 lb (176 kg)  ?02/15/22 (!) 395 lb (179.2 kg)  ? ? ?Results for orders placed or performed in visit on 04/23/22  ?POCT Urinalysis DIP (Proadvantage Device)  ?Result Value Ref Range  ? Color, UA yellow yellow  ? Clarity, UA clear clear  ? Glucose, UA negative negative mg/dL  ? Bilirubin, UA negative negative  ? Ketones, POC UA negative negative mg/dL  ? Specific Gravity, Urine 1.020   ? Blood, UA negative negative  ? pH, UA 6.0 5.0 - 8.0  ? Protein Ur, POC negative negative mg/dL  ? Urobilinogen, Ur 0.2   ? Nitrite, UA Negative Negative  ? Leukocytes, UA Negative Negative  ? ? ?   ?Assessment & Plan:  ?1. Urinary frequency ?- POCT Urinalysis DIP (Proadvantage Device)- normal ? ?2. Hematuria, unspecified type ?- urinalysis normal today, no hematuria, follow up with Urology ? ?3. History of pulmonary embolus (PE) ? ?4. History of DVT (deep vein thrombosis) ? ?5. Pedal edema ? ?6. Noncompliance w/medication  treatment due to intermit use of medication ? ?7. History of prostate cancer ? ?8. Class 3 severe obesity due to excess calories with serious comorbidity and body mass index (BMI) of 40.0 to 44.9 in adult [facility]) ? ? ? ?Meds ordered this encounter  ?Medications  ? rivaroxaban (XARELTO) 20 MG TABS tablet  ?  Sig: Take 1 tablet (20 mg total)  by mouth daily with supper.  ?  Dispense:  90 tablet  ?  Refill:  2  ?  Order Specific Question:   Supervising Provider  ?  Answer:   Denita Lung [[id number]]  ? ? ?Return in about 2 months (around 06/23/2022) for Return for Annual Exam with PCP Jimmye Norman. ? ?Irene Pap, PA-C ?

## 2022-04-26 ENCOUNTER — Encounter: Payer: Self-pay | Admitting: Physician Assistant

## 2022-04-26 DIAGNOSIS — R600 Localized edema: Secondary | ICD-10-CM | POA: Insufficient documentation

## 2022-04-26 DIAGNOSIS — Z8546 Personal history of malignant neoplasm of prostate: Secondary | ICD-10-CM | POA: Insufficient documentation

## 2022-04-26 DIAGNOSIS — Z91148 Patient's other noncompliance with medication regimen for other reason: Secondary | ICD-10-CM | POA: Insufficient documentation

## 2022-04-26 DIAGNOSIS — Z86718 Personal history of other venous thrombosis and embolism: Secondary | ICD-10-CM | POA: Insufficient documentation

## 2022-04-26 NOTE — Assessment & Plan Note (Signed)
Xarelto refilled.

## 2022-04-26 NOTE — Assessment & Plan Note (Signed)
Increase clear fluids, elevate legs, wear compression stockings on long drives and as needed ?

## 2022-04-26 NOTE — Assessment & Plan Note (Signed)
Stable, drink 8 - 10 glasses of water daily, limit sugar intake and eat a low fat diet, exercise 3 - 5 days a week, example walking 1 - 2 miles  ? ?

## 2022-04-26 NOTE — Assessment & Plan Note (Signed)
Refilled Xarelto, patient encouraged to take medicine as prescribed, advised to follow up with Cardiologist ?

## 2022-04-27 ENCOUNTER — Encounter (INDEPENDENT_AMBULATORY_CARE_PROVIDER_SITE_OTHER): Payer: Self-pay | Admitting: Nurse Practitioner

## 2022-04-27 ENCOUNTER — Ambulatory Visit (INDEPENDENT_AMBULATORY_CARE_PROVIDER_SITE_OTHER): Payer: BC Managed Care – PPO | Admitting: Nurse Practitioner

## 2022-04-27 VITALS — BP 90/62 | HR 102 | Temp 97.9°F | Ht 78.0 in | Wt 377.0 lb

## 2022-04-27 DIAGNOSIS — E1169 Type 2 diabetes mellitus with other specified complication: Secondary | ICD-10-CM | POA: Diagnosis not present

## 2022-04-27 DIAGNOSIS — I152 Hypertension secondary to endocrine disorders: Secondary | ICD-10-CM | POA: Diagnosis not present

## 2022-04-27 DIAGNOSIS — E669 Obesity, unspecified: Secondary | ICD-10-CM

## 2022-04-27 DIAGNOSIS — R600 Localized edema: Secondary | ICD-10-CM

## 2022-04-27 DIAGNOSIS — E785 Hyperlipidemia, unspecified: Secondary | ICD-10-CM

## 2022-04-27 DIAGNOSIS — E1159 Type 2 diabetes mellitus with other circulatory complications: Secondary | ICD-10-CM

## 2022-04-27 DIAGNOSIS — E559 Vitamin D deficiency, unspecified: Secondary | ICD-10-CM | POA: Diagnosis not present

## 2022-04-27 DIAGNOSIS — Z7985 Long-term (current) use of injectable non-insulin antidiabetic drugs: Secondary | ICD-10-CM

## 2022-04-27 DIAGNOSIS — Z6841 Body Mass Index (BMI) 40.0 and over, adult: Secondary | ICD-10-CM

## 2022-04-27 DIAGNOSIS — F5081 Binge eating disorder: Secondary | ICD-10-CM

## 2022-04-28 MED ORDER — TIRZEPATIDE 15 MG/0.5ML ~~LOC~~ SOAJ: 15.0000 mg | SUBCUTANEOUS | 3 refills | Status: DC

## 2022-04-28 MED ORDER — OLMESARTAN MEDOXOMIL-HCTZ 20-12.5 MG PO TABS: ORAL_TABLET | ORAL | 1 refills | Status: DC

## 2022-04-29 ENCOUNTER — Telehealth (INDEPENDENT_AMBULATORY_CARE_PROVIDER_SITE_OTHER): Payer: Self-pay

## 2022-04-29 NOTE — Progress Notes (Signed)
? ? ? ?Chief Complaint:  ? ?OBESITY ?Dean [name] is here to discuss his progress with his obesity treatment plan along with follow-up of his obesity related diagnoses. Dean [name] is on the Category 3 Plan and states he is following his eating plan approximately 70% of the time. Dean [name] states he is doing 0 minutes 0 times per week. ? ?Today's visit was #: 62 ?Starting weight: 419 lbs ?Starting date: 06/30/2021 ?Today's weight: 377 lbs ?Today's date: 04/27/2022 ?Total lbs lost to date: 42 lbs ?Total lbs lost since last in-office visit: 11 lbs ? ?Interim History: Dean [name] has been traveling a lot since his last visit. He is a Careers adviser. He will be traveling for the next 2 weeks. He denies hunger or cravings when following plan and on medications. He is drinking water and unsweetened tea. He has taken phentermine in the past.  ? ?Subjective:  ? ?1. Vitamin D deficiency ?Dean [name] is taking Vitamin D over the counter. He is unsure of dose. He denies side effects nausea, vomiting, and muscle weakness. His last Vitamin D was 28.8. ? ?2. Hypertension associated with diabetes ([place]) ?Dean [name] is currently taking Benicar HCT 20-12.5 mg (1/2 tablets). He denies side effects.He denies chest pain, shortness of breath and palpitations. His blood pressure is controlled. ? ?3. Type 2 diabetes mellitus with other specified complication, without long-term current use of insulin ([place]) ?Dean [name]'s A1C was 5.3.  Decreased from 8.3. He is taking Mounjaro 15 mg. Discussed side effects. Will check labs at next visit.  ? ?4. Hyperlipidemia associated with type 2 diabetes mellitus ([place]) ?Dean [name] is currently taking Lipitor 80 mg. He denies side effects.  ? ?5. Bilateral lower extremity edema ?Dean [name] is taking Lasix 20 mg. He denies shortness of breath.  ? ?Assessment/Plan:  ? ?1. Vitamin D deficiency ?Low Vitamin D level contributes to fatigue and are associated with obesity, breast, and colon cancer. Dean [name] will continue prescription Vitamin D as directed  and Dean [name] will follow-up for routine testing of Vitamin D, at least 2-3 times per year to avoid over-replacement. We will recheck labs at next visit.  ? ?2. Hypertension associated with diabetes ([place]) ?We will refill Benicar HCT 20-12.5 my 1/2 tablet for 1 month supply. We will check labs at next visit. Dean [name] is working on healthy weight loss and exercise to improve blood pressure control. We will watch for signs of hypotension as he continues his lifestyle modifications. ? ?- olmesartan-hydrochlorothiazide (BENICAR HCT) 20-12.5 MG tablet; Taking 0.5 tab daily  Dispense: 45 tablet; Refill: 1 ? ?3. Type 2 diabetes mellitus with other specified complication, without long-term current use of insulin ([name]) ?We will refill Mounjaro 15 mg for 1 month with no refills. We discussed side effects. We will check labs at next visit. Good blood sugar control is important to decrease the likelihood of diabetic complications such as nephropathy, neuropathy, limb loss, blindness, coronary artery disease, and death. Intensive lifestyle modification including diet, exercise and weight loss are the first line of treatment for diabetes.  ? ?- tirzepatide (MOUNJARO) 15 MG/0.5ML Pen; Inject 15 mg into the skin once a week.  Dispense: 2 mL; Refill: 3 ? ?4. Hyperlipidemia associated with type 2 diabetes mellitus ([place]) ?Cardiovascular risk and specific lipid/LDL goals reviewed.  Dean [name] will continue to follow up with his primary care physician. He will continue medications as directed. We will check labs at next visit. We discussed several lifestyle modifications today and Dean [name] will continue to work on diet, exercise and weight loss efforts. Orders and follow  up as documented in patient record.  ? ?Counseling ?Intensive lifestyle modifications are the first line treatment for this issue. ?Dietary changes: Increase soluble fiber. Decrease simple carbohydrates. ?Exercise changes: Moderate to vigorous-intensity aerobic activity 150 minutes  per week if tolerated. ?Lipid-lowering medications: see documented in medical record. ? ?5. Bilateral lower extremity edema ?We will refill Lasix 20 mg for 1 month with 1 refill. We discussed side effects. We will check labs at next visit.  ? ?6. Obesity BMI today is 43.7 ?Dean [name] is currently in the action stage of change. As such, his goal is to continue with weight loss efforts. He has agreed to keeping a food journal and adhering to recommended goals of 2000-2200 calories and 100 plus grams of protein.  ? ?Exercise goals:  As is.  ? ?Behavioral modification strategies: increasing lean protein intake, increasing water intake, no skipping meals, and travel eating strategies. ? ?Dean [name] has agreed to follow-up with our clinic in 4 weeks. He was informed of the importance of frequent follow-up visits to maximize his success with intensive lifestyle modifications for his multiple health conditions.  ? ?Objective:  ? ?Blood pressure 90/62, pulse (!) 102, temperature 97.9 ?F (36.6 ?C), height '6\' 6"'$  (1.981 m), weight (!) 377 lb (171 kg), SpO2 98 %. ?Body mass index is 43.57 kg/m?. ? ?General: Cooperative, alert, well developed, in no acute distress. ?HEENT: Conjunctivae and lids unremarkable. ?Cardiovascular: Regular rhythm.  ?Lungs: Normal work of breathing. ?Neurologic: No focal deficits.  ? ?Lab Results  ?Component Value Date  ? CREATININE 1.08 11/02/2021  ? BUN 19 11/02/2021  ? NA 140 11/02/2021  ? K 4.1 11/02/2021  ? CL 101 11/02/2021  ? CO2 25 11/02/2021  ? ?Lab Results  ?Component Value Date  ? ALT 21 11/02/2021  ? AST 16 11/02/2021  ? ALKPHOS 96 11/02/2021  ? BILITOT 0.4 11/02/2021  ? ?Lab Results  ?Component Value Date  ? HGBA1C 5.3 11/02/2021  ? HGBA1C 8.3 (H) 04/09/2021  ? HGBA1C 5.8 (A) 01/15/2021  ? HGBA1C 6.1 (A) 04/02/2020  ? HGBA1C 6.0 (A) 07/04/2019  ? ?No results found for: INSULIN ?Lab Results  ?Component Value Date  ? TSH 2.260 04/09/2021  ? ?Lab Results  ?Component Value Date  ? CHOL 140 11/02/2021  ?  HDL 48 11/02/2021  ? [place] 69 11/02/2021  ? TRIG 131 11/02/2021  ? CHOLHDL 3.8 01/15/2021  ? ?Lab Results  ?Component Value Date  ? VD25OH 28.8 (L) 11/02/2021  ? ?Lab Results  ?Component Value Date  ? WBC 5.9 04/09/2021  ? HGB 12.5 (L) 04/09/2021  ? HCT 38.4 04/09/2021  ? MCV 93 04/09/2021  ? PLT 210 04/09/2021  ? ?No results found for: IRON, TIBC, FERRITIN ? ?Attestation Statements:  ? ?Reviewed by clinician on day of visit: allergies, medications, problem list, medical history, surgical history, family history, social history, and previous encounter notes. ? ?I, Lizbeth Bark, RMA, am acting as Location manager for Everardo Pacific, FNP.  ? ?I have reviewed the above documentation for accuracy and completeness, and I agree with the above. Everardo Pacific, FNP  ?

## 2022-04-29 NOTE — Telephone Encounter (Signed)
See my chart message

## 2022-05-31 ENCOUNTER — Ambulatory Visit (INDEPENDENT_AMBULATORY_CARE_PROVIDER_SITE_OTHER): Payer: BC Managed Care – PPO | Admitting: Nurse Practitioner

## 2022-06-18 ENCOUNTER — Encounter: Payer: Self-pay | Admitting: Internal Medicine

## 2022-07-05 ENCOUNTER — Encounter: Payer: BC Managed Care – PPO | Admitting: Physician Assistant

## 2022-07-06 ENCOUNTER — Encounter: Payer: Self-pay | Admitting: Medical

## 2022-07-06 ENCOUNTER — Ambulatory Visit: Payer: BC Managed Care – PPO | Admitting: Medical

## 2022-07-06 VITALS — BP 110/70 | HR 89 | Wt 388.2 lb

## 2022-07-06 DIAGNOSIS — E559 Vitamin D deficiency, unspecified: Secondary | ICD-10-CM | POA: Diagnosis not present

## 2022-07-06 DIAGNOSIS — N529 Male erectile dysfunction, unspecified: Secondary | ICD-10-CM

## 2022-07-06 DIAGNOSIS — E1169 Type 2 diabetes mellitus with other specified complication: Secondary | ICD-10-CM

## 2022-07-06 DIAGNOSIS — Z0000 Encounter for general adult medical examination without abnormal findings: Secondary | ICD-10-CM | POA: Insufficient documentation

## 2022-07-06 DIAGNOSIS — K121 Other forms of stomatitis: Secondary | ICD-10-CM | POA: Insufficient documentation

## 2022-07-06 DIAGNOSIS — I700 Atherosclerosis of aorta: Secondary | ICD-10-CM

## 2022-07-06 DIAGNOSIS — M5136 Other intervertebral disc degeneration, lumbar region: Secondary | ICD-10-CM

## 2022-07-06 DIAGNOSIS — R600 Localized edema: Secondary | ICD-10-CM | POA: Diagnosis not present

## 2022-07-06 DIAGNOSIS — Z8546 Personal history of malignant neoplasm of prostate: Secondary | ICD-10-CM

## 2022-07-06 DIAGNOSIS — Z86718 Personal history of other venous thrombosis and embolism: Secondary | ICD-10-CM

## 2022-07-06 DIAGNOSIS — I152 Hypertension secondary to endocrine disorders: Secondary | ICD-10-CM

## 2022-07-06 DIAGNOSIS — G4733 Obstructive sleep apnea (adult) (pediatric): Secondary | ICD-10-CM

## 2022-07-06 DIAGNOSIS — E1159 Type 2 diabetes mellitus with other circulatory complications: Secondary | ICD-10-CM

## 2022-07-06 DIAGNOSIS — Z86711 Personal history of pulmonary embolism: Secondary | ICD-10-CM

## 2022-07-06 DIAGNOSIS — E785 Hyperlipidemia, unspecified: Secondary | ICD-10-CM

## 2022-07-06 DIAGNOSIS — J029 Acute pharyngitis, unspecified: Secondary | ICD-10-CM | POA: Insufficient documentation

## 2022-07-06 NOTE — Assessment & Plan Note (Signed)
Continue CPAP therapy 

## 2022-07-06 NOTE — Assessment & Plan Note (Signed)
Follow-up with your prostate doctor soon as planned.  We will call with PSA results

## 2022-07-06 NOTE — Patient Instructions (Signed)
This visit was a preventative care visit, also known as wellness visit or routine physical.   Topics typically include healthy lifestyle, diet, exercise, preventative care, vaccinations, sick and well care, proper use of emergency dept and after hours care, as well as other concerns.     Recommendations: Continue to return yearly for your annual wellness and preventative care visits.  This gives [place] a chance to discuss healthy lifestyle, exercise, vaccinations, review your chart record, and perform screenings where appropriate.  I recommend you see your eye doctor yearly for routine vision care.  I recommend you see your dentist yearly for routine dental care including hygiene visits twice yearly.   Vaccination recommendations were reviewed Immunization History  Administered Date(s) Administered   Influenza, Quadrivalent, Recombinant, Inj, Pf 10/05/2019   Influenza,inj,Quad PF,6+ Mos 01/22/2016, 12/28/2018, 01/15/2021   Influenza-Unspecified 01/22/2016, 12/28/2018, 01/15/2021   Moderna Sars-Covid-2 Vaccination 02/28/2020, 04/01/2020, 12/16/2020   Pneumococcal Conjugate-13 12/28/2018   Pneumococcal Polysaccharide-23 01/15/2021   Tdap 03/15/2017   Shingles vaccine:  I recommend you have a shingles vaccine to help prevent shingles or herpes zoster outbreak.   Please call your insurer to inquire about coverage for the Shingrix vaccine given in 2 doses.   Some insurers cover this vaccine after age 32, some cover this after age 46.  If your insurer covers this, then call to schedule appointment to have this vaccine here.  Get a flu shot yearly   Screening for cancer: Colon cancer screening: We will request copy of 2021 colonoscopy.  I do not have a copy of this  History of prostate cancer - follow up with your prostate doctor soon as planned    Skin cancer screening: Check your skin regularly for new changes, growing lesions, or other lesions of concern Come in for evaluation if you have  skin lesions of concern.  Lung cancer screening: If you have a greater than 20 pack year history of tobacco use, then you may qualify for lung cancer screening with a chest CT scan.   Please call your insurance company to inquire about coverage for this test.  We currently don't have screenings for other cancers besides breast, cervical, colon, and lung cancers.  If you have a strong family history of cancer or have other cancer screening concerns, please let me know.    Bone health: Get at least 150 minutes of aerobic exercise weekly Get weight bearing exercise at least once weekly Bone density test:  A bone density test is an imaging test that uses a type of X-ray to measure the amount of calcium and other minerals in your bones. The test may be used to diagnose or screen you for a condition that causes weak or thin bones (osteoporosis), predict your risk for a broken bone (fracture), or determine how well your osteoporosis treatment is working. The bone density test is recommended for females 22 and older, or females or males <24 if certain risk factors such as thyroid disease, long term use of steroids such as for asthma or rheumatological issues, vitamin D deficiency, estrogen deficiency, family history of osteoporosis, self or family history of fragility fracture in first degree relative.    Heart health: Get at least 150 minutes of aerobic exercise weekly Limit alcohol It is important to maintain a healthy blood pressure and healthy cholesterol numbers  Heart disease screening: Screening for heart disease includes screening for blood pressure, fasting lipids, glucose/diabetes screening, BMI height to weight ratio, reviewed of smoking status, physical activity, and diet.  Goals include blood pressure 120/80 or less, maintaining a healthy lipid/cholesterol profile, preventing diabetes or keeping diabetes numbers under good control, not smoking or using tobacco products, exercising  most days per week or at least 150 minutes per week of exercise, and eating healthy variety of fruits and vegetables, healthy oils, and avoiding unhealthy food choices like fried food, fast food, high sugar and high cholesterol foods.    I reviewed your CT coronary heart test and echocardiogram from 2022.    Medical care options: I recommend you continue to seek care here first for routine care.  We try really hard to have available appointments Monday through Friday daytime hours for sick visits, acute visits, and physicals.  Urgent care should be used for after hours and weekends for significant issues that cannot wait till the next day.  The emergency department should be used for significant potentially life-threatening emergencies.  The emergency department is expensive, can often have long wait times for less significant concerns, so try to utilize primary care, urgent care, or telemedicine when possible to avoid unnecessary trips to the emergency department.  Virtual visits and telemedicine have been introduced since the pandemic started in 2020, and can be convenient ways to receive medical care.  We offer virtual appointments as well to assist you in a variety of options to seek medical care.   Advanced Directives: I recommend you consider completing a [place] and Living Will.   These documents respect your wishes and help alleviate burdens on your loved ones if you were to become terminally ill or be in a position to need those documents enforced.    You can complete Advanced Directives yourself, have them notarized, then have copies made for our office, for you and for anybody you feel should have them in safe keeping.  Or, you can have an attorney prepare these documents.   If you haven't updated your Last Will and Testament in a while, it may be worthwhile having an attorney prepare these documents together and save on some costs.       Separate significant issues  discussed:  Problem List Items Addressed This Visit     Hypertension associated with diabetes ([place])    Since you are seeing low blood pressure readings, continue your olmesartan HCT 20/12.5 mg blood pressure pill at a 1/2 tablet daily until we call back in the next few days.  I will likely change this to plain olmesartan without HCT      History of pulmonary embolus (PE)    I will look over the chart to help determine whether you should stay on Xarelto or not.  I am not sure if you have had a hypercoagulable blood panel in the past which might be helpful.  For now continue Xarelto.  Sometimes we get hematology specialist involved to help make this decision      Morbid obesity ([place])    Continue efforts to lose weight through Diet, Getting Exercise Regularly Such As 150 Minutes/Week, Continuing Mounjaro and You Are Currently Seeing [facility]      Erectile dysfunction   OSA (obstructive sleep apnea)    Continue CPAP therapy      Aortic atherosclerosis (H[facility])    Continue cholesterol medicine low-cholesterol diet      Vitamin D deficiency    We will call with lab results and recommendations on supplement.  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  You do have some cholesterol buildup in your arteries per your heart scan last year.  So you definitely need to stay on a statin.  We will call with lab results further recommendations.  Work on eating a low-cholesterol diet.        Relevant Orders   Lipid panel   DDD (degenerative disc disease), lumbar   Diabetes mellitus ([place])    We will call with lab results Continue Mounjaro  Check your feet daily for sores or wounds See your eye doctor  yearly for diabetic eye exam and make sure they send [place] a copy Blood sugars at home should be 130 or less in the morning fasting which is go      Relevant Orders   Hemoglobin A1c   Microalbumin/Creatinine Ratio, Urine   History of DVT (deep vein thrombosis)   Pedal edema   History of prostate cancer    Follow-up with your prostate doctor soon as planned.  We will call with PSA results      Relevant Orders   PSA, total and free   Encounter for health maintenance examination in adult - Primary   Relevant Orders   PSA, total and free   Hemoglobin A1c   POCT Urinalysis DIP (Proadvantage Device)   VITAMIN D 25 Hydroxy (Vit-D Deficiency, Fractures)   Comprehensive metabolic panel   CBC   Lipid panel   Microalbumin/Creatinine Ratio, Urine   Sore throat    Use salt water gargle several times a day, plenty of water intake in general.  Let me get your labs back before making other decisions.  There is no clear indication that you need to be on antibiotic.  The mouth ulcer should go away on its own within a short period of time but if needed I can prescribe a cream that you can use topically on the ulcer.      Mouth ulcer

## 2022-07-06 NOTE — Assessment & Plan Note (Signed)
Continue cholesterol medicine low-cholesterol diet

## 2022-07-06 NOTE — Assessment & Plan Note (Signed)
We will call with lab results and recommendations on supplement.  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

## 2022-07-06 NOTE — Assessment & Plan Note (Signed)
I will look over the chart to help determine whether you should stay on Xarelto or not.  I am not sure if you have had a hypercoagulable blood panel in the past which might be helpful.  For now continue Xarelto.  Sometimes we get hematology specialist involved to help make this decision

## 2022-07-06 NOTE — Assessment & Plan Note (Signed)
Since you are seeing low blood pressure readings, continue your olmesartan HCT 20/12.5 mg blood pressure pill at a 1/2 tablet daily until we call back in the next few days.  I will likely change this to plain olmesartan without HCT

## 2022-07-06 NOTE — Assessment & Plan Note (Signed)
Uses Lasix as needed

## 2022-07-06 NOTE — Assessment & Plan Note (Signed)
Continue efforts to lose weight through Diet, Getting Exercise Regularly Such As 150 Minutes/Week, Continuing Mounjaro and You Are Currently Seeing the [facility]

## 2022-07-06 NOTE — Assessment & Plan Note (Signed)
Use salt water gargle several times a day, plenty of water intake in general.  Let me get your labs back before making other decisions.  There is no clear indication that you need to be on antibiotic.  The mouth ulcer should go away on its own within a short period of time but if needed I can prescribe a cream that you can use topically on the ulcer.

## 2022-07-06 NOTE — Assessment & Plan Note (Signed)
You reported you are currently doing 1/2 tablet of atorvastatin daily or 40 mg daily.  You do have some cholesterol buildup in your arteries per your heart scan last year.  So you definitely need to stay on a statin.  We will call with lab results further recommendations.  Work on eating a low-cholesterol diet.

## 2022-07-06 NOTE — Assessment & Plan Note (Signed)
We will call with lab results Continue Mounjaro  Check your feet daily for sores or wounds See your eye doctor yearly for diabetic eye exam and make sure they send [place] a copy Blood sugars at home should be 130 or less in the morning fasting which is go

## 2022-07-06 NOTE — Progress Notes (Signed)
Subjective:   HPI  Dean [name] is a 61 y.o. male who presents for Chief Complaint  Patient presents with   Annual Exam    Fasting CPE- no other concerns, he also has a sore in his mouth.    Patient Care Team: Deklin Bieler, Leward Quan as PCP - General (Family Medicine) Sees dentist Sees eye doctor Prostate Cancer specialist in [place], [place] Dr. Christinia Gully, pulmonology Dr. Quay Burow, cardiology Dr. Briscoe Deutscher, health weight and wellness clinic Rayshell Goecke, Camelia Eng, PA-C PCP as of 07/06/22 prior here with Harland Dingwall NP   Concerns: This is my first visit with him.  He was seeing other providers here prior  He has several medical issues.  He has a history of prior DVT and pulmonary embolism that happened 2 different times related to hormone therapy regarding prostate cancer  He is currently on Xarelto but only taking a half a tablet daily. he would like to come off of this medicine altogether.  He has been using half dose Xarelto for last 3 to 4 months at least  He notes that he is taking several of his medicines only half tablet daily  He is using 1/2 tablet of atorvastatin 80 mg daily.  He has been having low blood pressure readings only using 1/2 tablet of his olmesartan HCT.  He does not use the Lasix every day just when needed for swelling.  OSA-compliant with CPAP  His weight management clinic prescribes all of his medications including Mounjaro, Vyvanse.  He sees weight management clinic monthly  He sees his prostate cancer doctor in the next week or so  He had a sore throat about 2 weeks ago that did not completely resolved.  He still has some residual sore throat and has also the left buccal mucosa.  He would like some type of treatment for this   Reviewed their medical, surgical, family, social, medication, and allergy history and updated chart as appropriate.  Past Medical History:  Diagnosis Date   Aortic atherosclerosis ([place]) 04/13/2021   Back  pain    Controlled type 2 diabetes mellitus without complication, without long-term current use of insulin ([place]) 07/06/2016   Elevated PSA    H/O blood clots    Hyperlipidemia    Hypertension    Hypogonadism in male    Joint pain    Lower extremity edema    Morbid obesity ([place])    New onset type 2 diabetes mellitus ([place]) 07/06/2016   PE (pulmonary embolism) 2014   question testosterone pellets as cause   Prostate cancer ([place])    Sleep apnea    SOB (shortness of breath)     Past Surgical History:  Procedure Laterality Date   FOREARM SURGERY Left    JOINT REPLACEMENT Right    x 2 , right shoulder   SHOULDER SURGERY Right    WRIST SURGERY Right     Family History  Problem Relation Age of Onset   Hypertension Mother    Hyperlipidemia Father    Hypertension Father    Heart disease Father        11   Cancer Father    Dementia Father        15   Sleep apnea Father    Prostate cancer Brother 18     Current Outpatient Medications:    Blood Glucose Monitoring Suppl (ONETOUCH VERIO) w/Device KIT, 1 each by Other route daily., Disp: 1 kit, Rfl: 0   Glucose Blood (BLOOD GLUCOSE TEST  STRIPS) STRP, Test 1-2 times a day. Pt uses one touch ultra meter, Disp: 100 strip, Rfl: 2   Lancets (ONETOUCH ULTRASOFT) lancets, Test once a day. Pt will use a one touch verio meter, Disp: 100 each, Rfl: 1   Lisdexamfetamine Dimesylate (VYVANSE) 60 MG CHEW, Chew 60 mg by mouth every morning., Disp: 30 tablet, Rfl: 0   Multiple Vitamin (MULTIVITAMIN) tablet, Take 1 tablet by mouth daily., Disp: , Rfl:    olmesartan-hydrochlorothiazide (BENICAR HCT) 20-12.5 MG tablet, Taking 0.5 tab daily, Disp: 45 tablet, Rfl: 1   rivaroxaban (XARELTO) 20 MG TABS tablet, Take 1 tablet (20 mg total) by mouth daily with supper., Disp: 90 tablet, Rfl: 2   tamsulosin (FLOMAX) 0.4 MG CAPS capsule, Take 0.4 mg by mouth at bedtime., Disp: , Rfl:    tirzepatide (MOUNJARO) 15 MG/0.5ML Pen, Inject 15 mg into the skin once a  week., Disp: 2 mL, Rfl: 3   atorvastatin (LIPITOR) 80 MG tablet, Take 1 tablet (80 mg total) by mouth every other day., Disp: 45 tablet, Rfl: 3   furosemide (LASIX) 20 MG tablet, Take 1 tablet (20 mg total) by mouth daily., Disp: 90 tablet, Rfl: 1  Allergies  Allergen Reactions   Penicillins Rash and Anaphylaxis     Review of Systems Constitutional: -fever, -chills, -sweats, -unexpected weight change, -decreased appetite, -fatigue Allergy: -sneezing, -itching, -congestion Dermatology: -changing moles, --rash, -lumps ENT: -runny nose, -ear pain, +sore throat, -hoarseness, -sinus pain, -teeth pain, - ringing in ears, -hearing loss, -nosebleeds Cardiology: -chest pain, -palpitations, -swelling, -difficulty breathing when lying flat, -waking up short of breath Respiratory: -cough, -shortness of breath, -difficulty breathing with exercise or exertion, -wheezing, -coughing up blood Gastroenterology: -abdominal pain, -nausea, -vomiting, -diarrhea, -constipation, -blood in stool, -changes in bowel movement, -difficulty swallowing or eating Hematology: -bleeding, -bruising  Musculoskeletal: -joint aches, -muscle aches, -joint swelling, -back pain, -neck pain, -cramping, -changes in gait Ophthalmology: denies vision changes, eye redness, itching, discharge Urology: -burning with urination, -difficulty urinating, -blood in urine, -urinary frequency, -urgency, -incontinence Neurology: -headache, -weakness, -tingling, -numbness, -memory loss, -falls, -dizziness Psychology: -depressed mood, -agitation, -sleep problems Male GU: no testicular mass, pain, no lymph nodes swollen, no swelling, no rash.     07/06/2022    3:30 PM 04/23/2022    1:34 PM 06/30/2021    7:22 AM 04/09/2021    3:45 PM 01/15/2021   10:25 AM  Depression screen PHQ 2/9  Decreased Interest 0 0 3 0 0  Down, Depressed, Hopeless 0 0 1 0 0  PHQ - 2 Score 0 0 4 0 0  Altered sleeping   0    Tired, decreased energy   3    Change in  appetite   1    Feeling bad or failure about yourself    0    Trouble concentrating   1    Moving slowly or fidgety/restless   1    Suicidal thoughts   0    PHQ-9 Score   10    Difficult doing work/chores   Not difficult at all          Objective:  BP 110/70   Pulse 89   Wt (!) 388 lb 3.2 oz (176.1 kg)   SpO2 96%   BMI 44.86 kg/m   General appearance: alert, no distress, WD/WN, Caucasian male Skin: unremarkable HEENT: normocephalic, conjunctiva/corneas normal, sclerae anicteric, PERRLA, EOMi, nares patent, no discharge or erythema, pharynx normal Oral cavity: MMM, tongue normal, teeth in good repair Neck: supple,  no lymphadenopathy, no thyromegaly, no masses, normal ROM, no bruits Chest: non tender, normal shape and expansion Heart: RRR, normal S1, S2, no murmurs Lungs: CTA bilaterally, no wheezes, rhonchi, or rales Abdomen: +bs, soft, non tender, non distended, no masses, no hepatomegaly, no splenomegaly, no bruits Back: non tender, normal ROM, no scoliosis Musculoskeletal: missing a few fingers of left hand, surgical scar left lateral forearm, otherwise upper extremities non tender, no obvious deformity, normal ROM throughout, lower extremities non tender, no obvious deformity, normal ROM throughout Extremities: no edema, no cyanosis, no clubbing Pulses: 2+ symmetric, upper and lower extremities, normal cap refill Neurological: alert, oriented x 3, CN2-12 intact, strength normal upper extremities and lower extremities, sensation normal throughout, DTRs 2+ throughout, no cerebellar signs, gait normal Psychiatric: normal affect, behavior normal, pleasant  GU/rectal - deferred to urology   Assessment and Plan :   Encounter Diagnoses  Name Primary?   Encounter for health maintenance examination in adult Yes   Vitamin D deficiency    Pedal edema    Morbid obesity ([place])    Hypertension associated with diabetes ([place])    Hyperlipidemia associated with type 2 diabetes mellitus  ([place])    History of pulmonary embolus ([facility])    History of prostate cancer    History of DVT (deep vein thrombosis)    Erectile dysfunction, unspecified erectile dysfunction type    Aortic atherosclerosis ([facility])    DDD (degenerative disc disease), lumbar    Type 2 diabetes mellitus with other specified complication, without long-term current use of insulin ([facility])    OSA (obstructive sleep apnea)    Sore throat    Mouth ulcer     This visit was a preventative care visit, also known as wellness visit or routine physical.   Topics typically include healthy lifestyle, diet, exercise, preventative care, vaccinations, sick and well care, proper use of emergency dept and after hours care, as well as other concerns.     Recommendations: Continue to return yearly for your annual wellness and preventative care visits.  This gives [place] a chance to discuss healthy lifestyle, exercise, vaccinations, review your chart record, and perform screenings where appropriate.  I recommend you see your eye doctor yearly for routine vision care.  I recommend you see your dentist yearly for routine dental care including hygiene visits twice yearly.   Vaccination recommendations were reviewed Immunization History  Administered Date(s) Administered   Influenza, Quadrivalent, Recombinant, Inj, Pf 10/05/2019   Influenza,inj,Quad PF,6+ Mos 01/22/2016, 12/28/2018, 01/15/2021   Influenza-Unspecified 01/22/2016, 12/28/2018, 01/15/2021   Moderna Sars-Covid-2 Vaccination 02/28/2020, 04/01/2020, 12/16/2020   Pneumococcal Conjugate-13 12/28/2018   Pneumococcal Polysaccharide-23 01/15/2021   Tdap 03/15/2017   Shingles vaccine:  I recommend you have a shingles vaccine to help prevent shingles or herpes zoster outbreak.   Please call your insurer to inquire about coverage for the Shingrix vaccine given in 2 doses.   Some insurers cover this vaccine after age 66, some cover this after age 27.  If your insurer covers this, then  call to schedule appointment to have this vaccine here.  Get a flu shot yearly   Screening for cancer: Colon cancer screening: We will request copy of 2021 colonoscopy.  I do not have a copy of this  History of prostate cancer - follow up with your prostate doctor soon as planned    Skin cancer screening: Check your skin regularly for new changes, growing lesions, or other lesions of concern Come in for evaluation if you have skin  lesions of concern.  Lung cancer screening: If you have a greater than 20 pack year history of tobacco use, then you may qualify for lung cancer screening with a chest CT scan.   Please call your insurance company to inquire about coverage for this test.  We currently don't have screenings for other cancers besides breast, cervical, colon, and lung cancers.  If you have a strong family history of cancer or have other cancer screening concerns, please let me know.    Bone health: Get at least 150 minutes of aerobic exercise weekly Get weight bearing exercise at least once weekly Bone density test:  A bone density test is an imaging test that uses a type of X-ray to measure the amount of calcium and other minerals in your bones. The test may be used to diagnose or screen you for a condition that causes weak or thin bones (osteoporosis), predict your risk for a broken bone (fracture), or determine how well your osteoporosis treatment is working. The bone density test is recommended for females 78 and older, or females or males <99 if certain risk factors such as thyroid disease, long term use of steroids such as for asthma or rheumatological issues, vitamin D deficiency, estrogen deficiency, family history of osteoporosis, self or family history of fragility fracture in first degree relative.    Heart health: Get at least 150 minutes of aerobic exercise weekly Limit alcohol It is important to maintain a healthy blood pressure and healthy cholesterol  numbers  Heart disease screening: Screening for heart disease includes screening for blood pressure, fasting lipids, glucose/diabetes screening, BMI height to weight ratio, reviewed of smoking status, physical activity, and diet.    Goals include blood pressure 120/80 or less, maintaining a healthy lipid/cholesterol profile, preventing diabetes or keeping diabetes numbers under good control, not smoking or using tobacco products, exercising most days per week or at least 150 minutes per week of exercise, and eating healthy variety of fruits and vegetables, healthy oils, and avoiding unhealthy food choices like fried food, fast food, high sugar and high cholesterol foods.    I reviewed your CT coronary heart test and echocardiogram from 2022.    Medical care options: I recommend you continue to seek care here first for routine care.  We try really hard to have available appointments Monday through Friday daytime hours for sick visits, acute visits, and physicals.  Urgent care should be used for after hours and weekends for significant issues that cannot wait till the next day.  The emergency department should be used for significant potentially life-threatening emergencies.  The emergency department is expensive, can often have long wait times for less significant concerns, so try to utilize primary care, urgent care, or telemedicine when possible to avoid unnecessary trips to the emergency department.  Virtual visits and telemedicine have been introduced since the pandemic started in 2020, and can be convenient ways to receive medical care.  We offer virtual appointments as well to assist you in a variety of options to seek medical care.   Advanced Directives: I recommend you consider completing a [place] and Living Will.   These documents respect your wishes and help alleviate burdens on your loved ones if you were to become terminally ill or be in a position to need those  documents enforced.    You can complete Advanced Directives yourself, have them notarized, then have copies made for our office, for you and for anybody you feel should  have them in safe keeping.  Or, you can have an attorney prepare these documents.   If you haven't updated your Last Will and Testament in a while, it may be worthwhile having an attorney prepare these documents together and save on some costs.       Separate significant issues discussed:  Problem List Items Addressed This Visit     Hypertension associated with diabetes ([place])    Since you are seeing low blood pressure readings, continue your olmesartan HCT 20/12.5 mg blood pressure pill at a 1/2 tablet daily until we call back in the next few days.  I will likely change this to plain olmesartan without HCT      History of pulmonary embolus ([facility])    I will look over the chart to help determine whether you should stay on Xarelto or not.  I am not sure if you have had a hypercoagulable blood panel in the past which might be helpful.  For now continue Xarelto.  Sometimes we get hematology specialist involved to help make this decision      Morbid obesity ([facility])    Continue efforts to lose weight through Diet, Getting Exercise Regularly Such As 150 Minutes/Week, Continuing Mounjaro and You Are Currently Seeing the [facility]      Erectile dysfunction   OSA (obstructive sleep apnea)    Continue CPAP therapy      Aortic atherosclerosis ([facility])    Continue cholesterol medicine low-cholesterol diet      Vitamin D deficiency    We will call with lab results and recommendations on supplement.  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  You  do have some cholesterol buildup in your arteries per your heart scan last year.  So you definitely need to stay on a statin.  We will call with lab results further recommendations.  Work on eating a low-cholesterol diet.        Relevant Orders   Lipid panel   DDD (degenerative disc disease), lumbar   Diabetes mellitus ([place])    We will call with lab results Continue Mounjaro  Check your feet daily for sores or wounds See your eye doctor yearly for diabetic eye exam and make sure they send [place] a copy Blood sugars at home should be 130 or less in the morning fasting which is go      Relevant Orders   Hemoglobin A1c   History of DVT (deep vein thrombosis)   Pedal edema    Uses Lasix as needed      History of prostate cancer    Follow-up with your prostate doctor soon as planned.  We will call with PSA results      Relevant Orders   PSA, total and free   Encounter for health maintenance examination in adult - Primary   Relevant Orders   PSA, total and free   Hemoglobin A1c   VITAMIN D 25 Hydroxy (Vit-D Deficiency, Fractures)   Comprehensive metabolic panel   CBC   Lipid panel   Sore throat    Use salt water gargle several times a day, plenty of water intake in general.  Let me  get your labs back before making other decisions.  There is no clear indication that you need to be on antibiotic.  The mouth ulcer should go away on its own within a short period of time but if needed I can prescribe a cream that you can use topically on the ulcer.      Mouth ulcer    Follow-up pending labs, yearly for physical

## 2022-07-07 ENCOUNTER — Other Ambulatory Visit: Payer: Self-pay | Admitting: Medical

## 2022-07-07 ENCOUNTER — Telehealth: Payer: Self-pay | Admitting: Medical

## 2022-07-07 DIAGNOSIS — R600 Localized edema: Secondary | ICD-10-CM

## 2022-07-07 DIAGNOSIS — E1169 Type 2 diabetes mellitus with other specified complication: Secondary | ICD-10-CM

## 2022-07-07 LAB — COMPREHENSIVE METABOLIC PANEL
ALT: 21 IU/L (ref 0–44)
AST: 17 IU/L (ref 0–40)
Albumin/Globulin Ratio: 1.9 (ref 1.2–2.2)
Albumin: 4.4 g/dL (ref 3.8–4.9)
Alkaline Phosphatase: 106 IU/L (ref 44–121)
BUN/Creatinine Ratio: 14 (ref 9–20)
BUN: 17 mg/dL (ref 6–24)
Bilirubin Total: 0.7 mg/dL (ref 0.0–1.2)
CO2: 22 mmol/L (ref 20–29)
Calcium: 9.6 mg/dL (ref 8.7–10.2)
Chloride: 102 mmol/L (ref 96–106)
Creatinine, Ser: 1.23 mg/dL (ref 0.76–1.27)
Globulin, Total: 2.3 g/dL (ref 1.5–4.5)
Glucose: 86 mg/dL (ref 70–99)
Potassium: 4.2 mmol/L (ref 3.5–5.2)
Sodium: 142 mmol/L (ref 134–144)
Total Protein: 6.7 g/dL (ref 6.0–8.5)
eGFR: 68 mL/min/{1.73_m2} (ref >59)

## 2022-07-07 LAB — LIPID PANEL
Chol/HDL Ratio: 3.3 ratio (ref 0.0–5.0)
Cholesterol, Total: 156 mg/dL (ref 100–199)
HDL: 47 mg/dL (ref >39)
LDL Chol Calc (NIH): 85 mg/dL (ref 0–99)
Triglycerides: 136 mg/dL (ref 0–149)
VLDL Cholesterol Cal: 24 mg/dL (ref 5–40)

## 2022-07-07 LAB — CBC
Hematocrit: 41.3 % (ref 37.5–51.0)
Hemoglobin: 13.9 g/dL (ref 13.0–17.7)
MCH: 29.3 pg (ref 26.6–33.0)
MCHC: 33.7 g/dL (ref 31.5–35.7)
MCV: 87 fL (ref 79–97)
Platelets: 238 10*3/uL (ref 150–450)
RBC: 4.75 x10E6/uL (ref 4.14–5.80)
RDW: 14 % (ref 11.6–15.4)
WBC: 5.8 10*3/uL (ref 3.4–10.8)

## 2022-07-07 LAB — PSA, TOTAL AND FREE
PSA, Free Pct: 10 %
PSA, Free: 0.03 ng/mL
Prostate Specific Ag, Serum: 0.3 ng/mL (ref 0.0–4.0)

## 2022-07-07 LAB — HEMOGLOBIN A1C
Est. average glucose Bld gHb Est-mCnc: 108 mg/dL
Hgb A1c MFr Bld: 5.4 % (ref 4.8–5.6)

## 2022-07-07 LAB — VITAMIN D 25 HYDROXY (VIT D DEFICIENCY, FRACTURES): Vit D, 25-Hydroxy: 35.8 ng/mL (ref 30.0–100.0)

## 2022-07-07 MED ORDER — ATORVASTATIN CALCIUM 40 MG PO TABS: 40.0000 mg | ORAL_TABLET | Freq: Every day | ORAL | 3 refills | Status: DC

## 2022-07-07 MED ORDER — ONETOUCH ULTRASOFT LANCETS MISC: 1 refills | Status: DC

## 2022-07-07 MED ORDER — BLOOD GLUCOSE TEST VI STRP: ORAL_STRIP | 2 refills | Status: DC

## 2022-07-07 MED ORDER — FUROSEMIDE 20 MG PO TABS: 20.0000 mg | ORAL_TABLET | Freq: Every day | ORAL | 1 refills | Status: DC

## 2022-07-07 MED ORDER — TIRZEPATIDE 15 MG/0.5ML ~~LOC~~ SOAJ: 15.0000 mg | SUBCUTANEOUS | 3 refills | Status: DC

## 2022-07-07 MED ORDER — OLMESARTAN MEDOXOMIL 20 MG PO TABS: 20.0000 mg | ORAL_TABLET | Freq: Every day | ORAL | 3 refills | Status: AC

## 2022-07-07 NOTE — Telephone Encounter (Signed)
Dean [name] called you back says he was returning a call. You can call back whenever available.

## 2022-07-08 LAB — MICROALBUMIN / CREATININE URINE RATIO
Creatinine, Urine: 88.6 mg/dL
Microalb/Creat Ratio: 14 mg/g creat (ref 0–29)
Microalbumin, Urine: 12.6 ug/mL

## 2022-07-28 ENCOUNTER — Encounter (INDEPENDENT_AMBULATORY_CARE_PROVIDER_SITE_OTHER): Payer: Self-pay

## 2022-11-21 ENCOUNTER — Other Ambulatory Visit: Payer: Self-pay | Admitting: Sports Medicine

## 2022-12-25 IMAGING — CT CT ANGIO CHEST
2 of 7 series · 17 of 46 positions shown · IV contrast (omnipaque)
Comparison: Plain film 04/09/2021.  Chest CT 05/16/2013

CLINICAL DATA: Tachycardia. History of pulmonary embolism. On
hormone therapy for prostate cancer. Shortness of breath.
Hypertension.

EXAM:
CT ANGIOGRAPHY CHEST WITH CONTRAST
TECHNIQUE: Multidetector CT imaging of the chest was performed using the
standard protocol during bolus administration of intravenous
contrast. Multiplanar CT image reconstructions and MIPs were
obtained to evaluate the vascular anatomy.
CONTRAST:  100mL OMNIPAQUE IOHEXOL 350 MG/ML SOLN

[Series 5: pe axial thins · axial · 0.96mm/px · z∈[+1241,+1527]mm · 14 of 332 slices shown]
[im 23/332  lung]
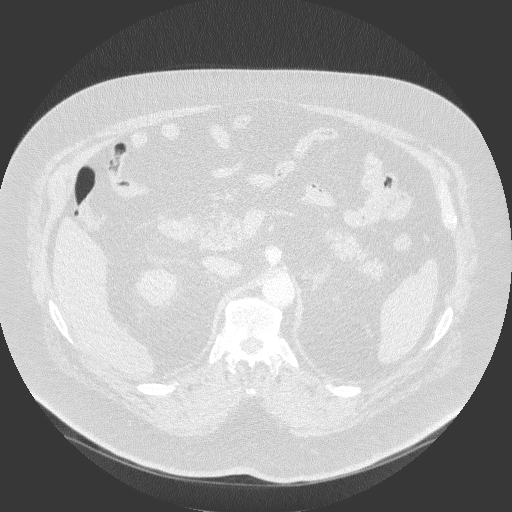
[im 45/332  soft-tissue]
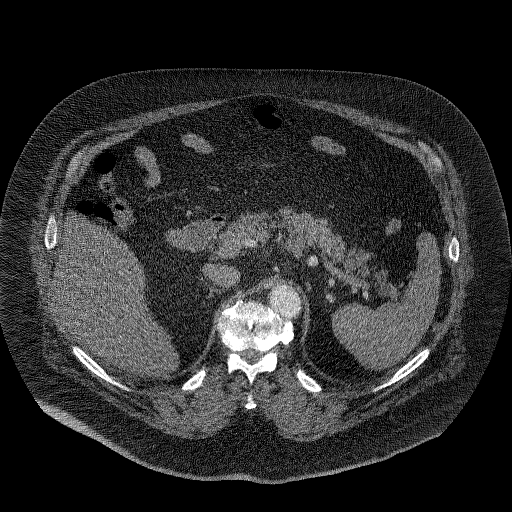
[im 67/332  lung]
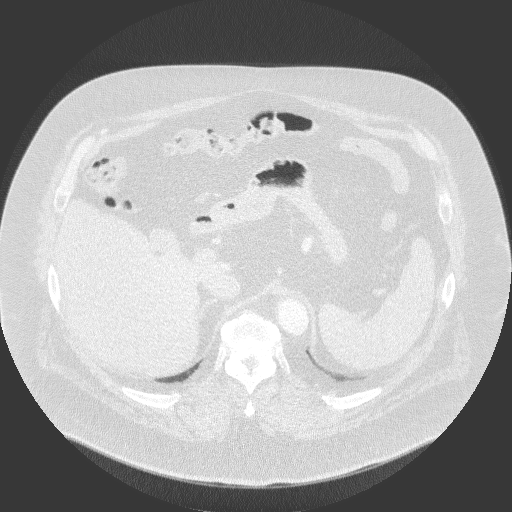
[im 89/332  soft-tissue]
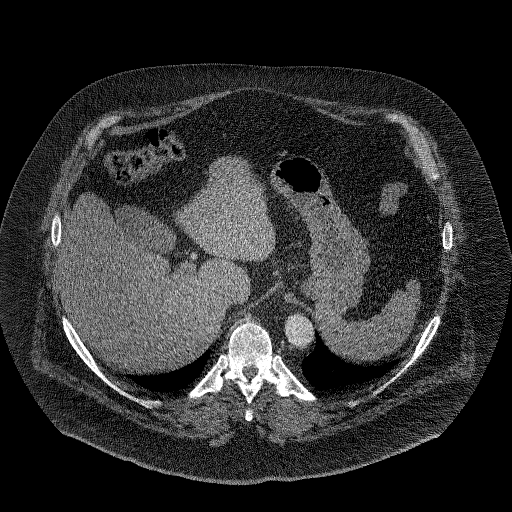
[im 111/332  lung]
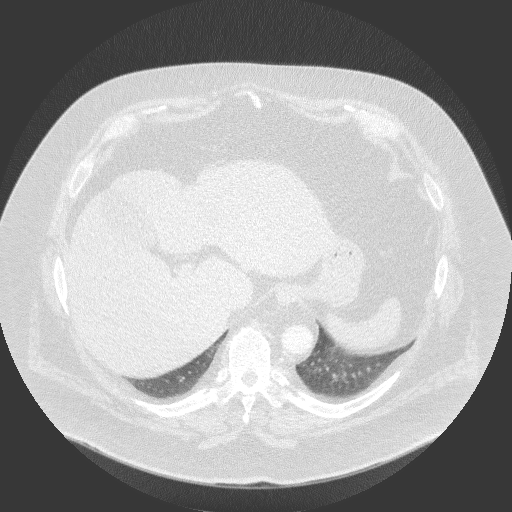
[im 133/332  soft-tissue]
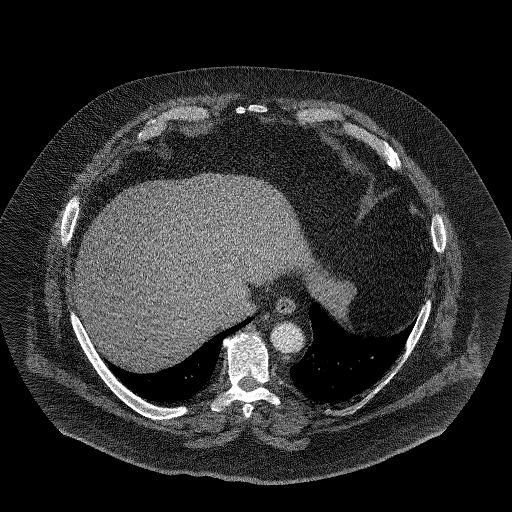
[im 155/332  lung]
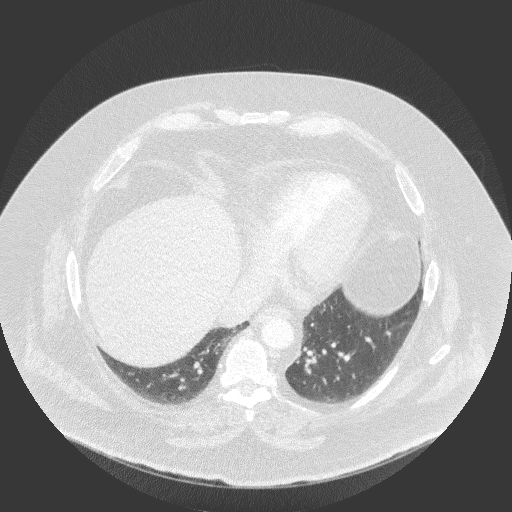
[im 177/332  soft-tissue]
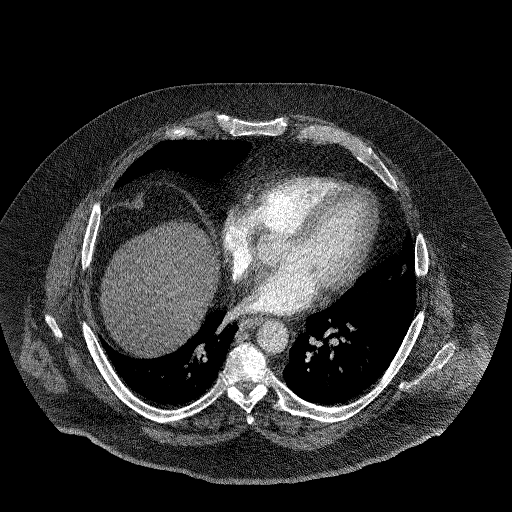
[im 199/332  lung]
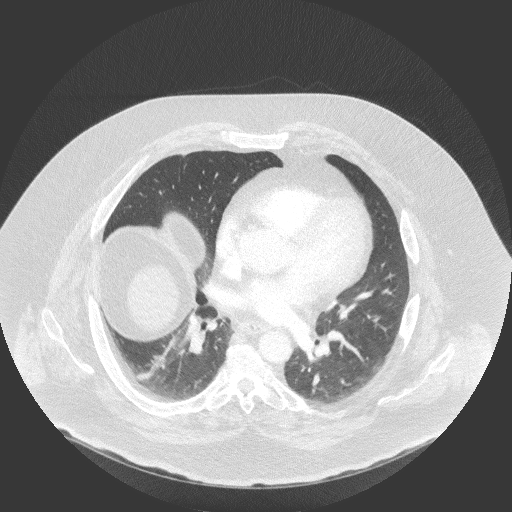
[im 221/332  soft-tissue]
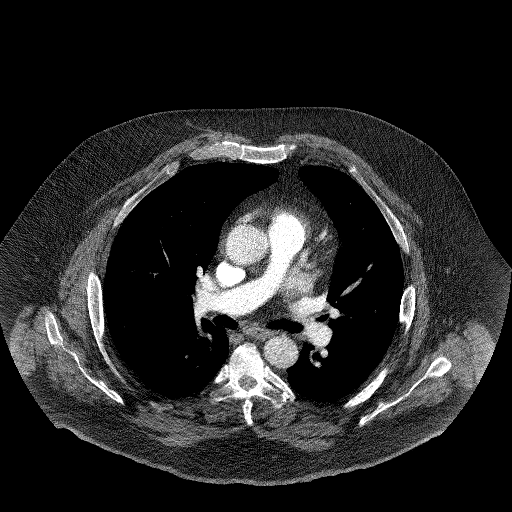
[im 243/332  lung]
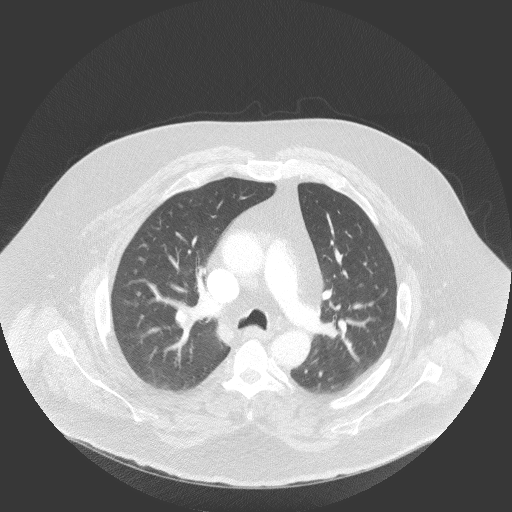
[im 265/332  soft-tissue]
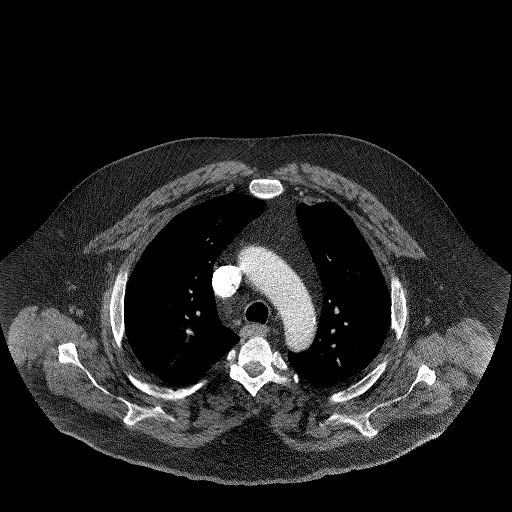
[im 287/332  lung]
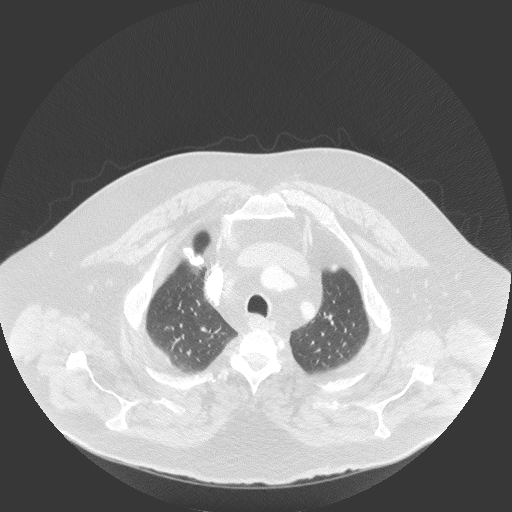
[im 309/332  soft-tissue]
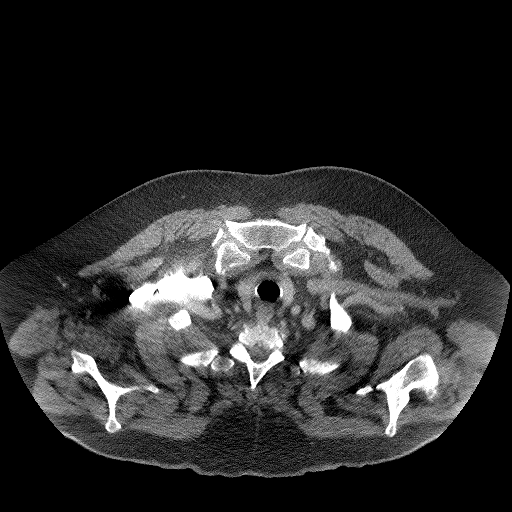

[Series 7: cor soft · coronal · 0.68mm/px · 3 of 214 slices shown]
[im 54/214  soft-tissue]
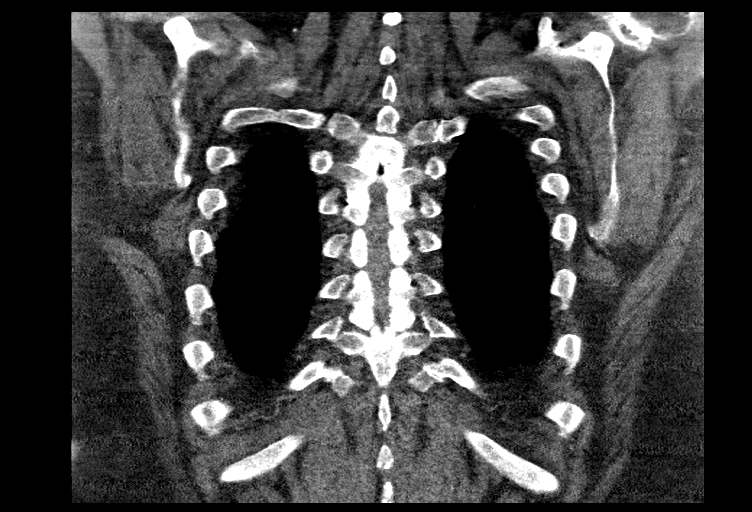
[im 107/214  soft-tissue]
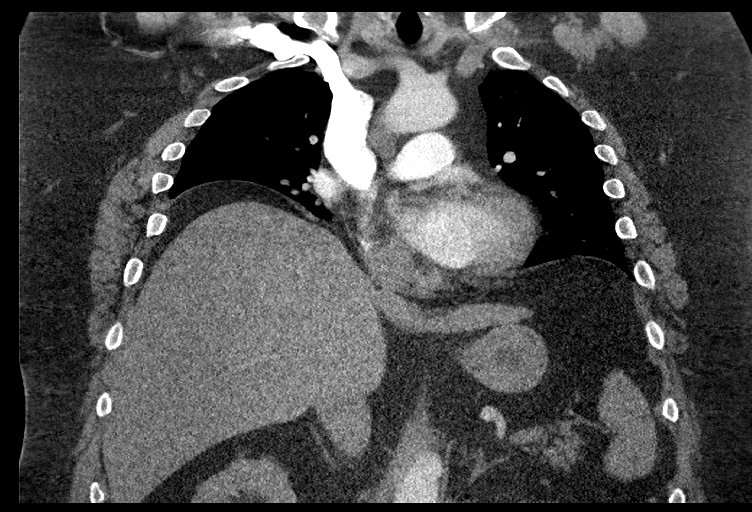
[im 160/214  soft-tissue]
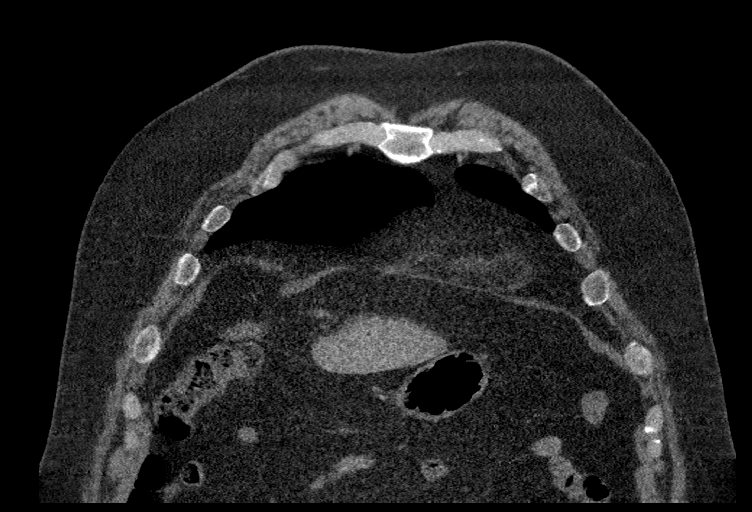

[17 of 46 positions shown; findings below may reference images not displayed]

FINDINGS: Cardiovascular: The quality of this exam for evaluation of pulmonary
embolism is poor. Limitations include patient body habitus and bolus
timing, with contrast centered in the SVC. Filling defects in the
right upper lobe including on images 88 and 90 of series 5, coronal
image 95 are suspicious for pulmonary embolism. The right lower lobe
segmental and subsegmental branches are also relatively
hypoenhancing including on 74/4.

Aortic atherosclerosis. Mild cardiomegaly. No evidence of right
heart strain.

Mediastinum/Nodes: No mediastinal or hilar adenopathy.

Lungs/Pleura: No pleural fluid. Right hemidiaphragm elevation. Right
lower lobe and lingular scarring.

Upper Abdomen: Caudate lobe prominence is nonspecific. Normal imaged
portions of the spleen, gallbladder, stomach, pancreas, adrenal
glands, kidneys.

Musculoskeletal: Right shoulder arthroplasty.

Review of the MIP images confirms the above findings.
IMPRESSION: 1. Although the quality for evaluation of pulmonary embolism is
limited, filling defects within right upper and possibly right lower
lobes are suspicious for pulmonary emboli. Given similar
distribution of larger volume emboli on 05/16/2013, chronic emboli
are a consideration.
2. No evidence of right heart strain.
3.  Aortic Atherosclerosis ([email or web address]).

These results will be called to the ordering clinician or
representative by the Radiologist Assistant, and communication
documented in the PACS or [REDACTED].

## 2023-02-03 ENCOUNTER — Encounter: Payer: Self-pay | Admitting: Cardiovascular Disease

## 2023-02-03 ENCOUNTER — Ambulatory Visit: Payer: BC Managed Care – PPO | Attending: Cardiovascular Disease | Admitting: Cardiovascular Disease

## 2023-02-03 VITALS — BP 132/88 | HR 72 | Ht 78.0 in | Wt >= 6400 oz

## 2023-02-03 DIAGNOSIS — Z86711 Personal history of pulmonary embolism: Secondary | ICD-10-CM | POA: Diagnosis not present

## 2023-02-03 DIAGNOSIS — I152 Hypertension secondary to endocrine disorders: Secondary | ICD-10-CM | POA: Diagnosis not present

## 2023-02-03 DIAGNOSIS — E1159 Type 2 diabetes mellitus with other circulatory complications: Secondary | ICD-10-CM

## 2023-02-03 DIAGNOSIS — E1169 Type 2 diabetes mellitus with other specified complication: Secondary | ICD-10-CM

## 2023-02-03 DIAGNOSIS — G4733 Obstructive sleep apnea (adult) (pediatric): Secondary | ICD-10-CM | POA: Diagnosis not present

## 2023-02-03 DIAGNOSIS — E785 Hyperlipidemia, unspecified: Secondary | ICD-10-CM

## 2023-02-03 MED ORDER — ATORVASTATIN CALCIUM 40 MG PO TABS: 40.0000 mg | ORAL_TABLET | Freq: Every day | ORAL | 3 refills | Status: AC

## 2023-02-03 NOTE — Patient Instructions (Signed)
Medication Instructions:  Your physician has recommended you make the following change in your medication:   -Restart atorvastatin (lipitor) 67m once daily.  *If you need a refill on your cardiac medications before your next appointment, please call your pharmacy*   Lab Work: Your physician recommends that you return for lab work in: 3 months for FASTING lipid/liver panel.  If you have labs (blood work) drawn today and your tests are completely normal, you will receive your results only by: [place](if you have MyChart) OR A paper copy in the mail If you have any lab test that is abnormal or we need to change your treatment, we will call you to review the results.   Follow-Up: [facility] and your health needs are our priority.  As part of our continuing mission to provide you with exceptional heart care, we have created designated Provider Care Teams.  These Care Teams include your primary Cardiologist (physician) and Advanced Practice Providers (APPs -  Physician Assistants and Nurse Practitioners) who all work together to provide you with the care you need, when you need it.  We recommend signing up for the patient portal called "MyChart".  Sign up information is provided on this After Visit Summary.  MyChart is used to connect with patients for Virtual Visits (Telemedicine).  Patients are able to view lab/test results, encounter notes, upcoming appointments, etc.  Non-urgent messages can be sent to your provider as well.   To learn more about what you can do with MyChart, go to [email or web address]    Your next appointment:   12 month(s)  Provider:   JQuay Burow MD

## 2023-02-03 NOTE — Assessment & Plan Note (Signed)
History of hyperlipidemia on atorvastatin when she is taking every other day now.  His most recent lipid profile performed 01/18/2023 revealed total cholesterol of 158, LDL of 86 and HDL of 50, not at goal for secondary prevention given his elevated coronary calcium score of 181.  I told him to take his atorvastatin on a daily basis and we will recheck a lipid liver profile in 3 months.

## 2023-02-03 NOTE — Progress Notes (Signed)
02/03/2023 Kristopher Glee   02-25-1961  [id number]  Primary Physician Tysinger, Camelia Eng, PA-C Primary Cardiologist: Lorretta Harp MD Renae Gloss  HPI:  Dean [name] is a 62 y.o.  morbidly overweight married Caucasian male father of 3 children who currently works as a Careers adviser at [employer].  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  I last saw him in the office 07/15/2021.  He has a remote history of pulmonary embolism.  He is also recently been diagnosed with prostate cancer and just finished radiation therapy.  He did see Rosaria Ferries, PA-C in the office 05/08/2018 for symptoms of dyspnea on exertion.  2D echo was ordered but this was never completed.  He does have a history of obstructive sleep apnea on BiPAP.  His cardiovascular risk factor profile otherwise is notable for treated hypertension, diabetes and hyperlipidemia.  His father did have a myocardial infarction at age 37.  He is never had a heart attack or stroke.  He has had increasing shortness of breath recently which led to a chest CT performed 04/13/2021 revealing segmental and subsegmental pulmonary emboli.  He was placed on Xarelto.  Because of lower extremity edema he was also placed on furosemide which resulted in moderate increase in his serum creatinine up to 1.79 and this was discontinued.  I obtain a 2D echocardiogram that showed normal LV systolic function with grade 1 diastolic dysfunction.  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  He does state that his dyspnea has mildly improved but he still is short of breath compared to prior to his pulmonary embolism.    Since I saw him in the office a year and a half ago he has stopped his Xarelto 5 months ago on his own and is taking most of his medications on every other day basis, not as prescribed.  He is seeing weight loss  center and has lost some weight.  He he denies chest pain or shortness of breath.  His most recent lipid profile performed 1/30/202 24 revealed total cholesterol of 158, LDL of 86 and HDL 50, not at goal for secondary prevention.     Current Meds  Medication Sig   atorvastatin (LIPITOR) 40 MG tablet Take 1 tablet (40 mg total) by mouth daily.   Blood Glucose Monitoring Suppl (ONETOUCH VERIO) w/Device KIT 1 each by Other route daily.   Multiple Vitamin (MULTIVITAMIN) tablet Take 1 tablet by mouth daily.   olmesartan (BENICAR) 20 MG tablet Take 1 tablet (20 mg total) by mouth daily.   phentermine (ADIPEX-P) 37.5 MG tablet Take 1/2-1 tablet Orally Once a day for 90 days   tamsulosin (FLOMAX) 0.4 MG CAPS capsule Take 0.4 mg by mouth at bedtime.   tirzepatide (MOUNJARO) 15 MG/0.5ML Pen Inject 15 mg into the skin once a week.     Allergies  Allergen Reactions   Penicillins Rash and Anaphylaxis    Social History   Socioeconomic History   Marital status: Married    Spouse name: Not on file   Number of children: Not on file   Years of education: Not on file   Highest education level: Not on file  Occupational History   Occupation: Research officer, political party  Tobacco Use   Smoking status: Never   Smokeless tobacco: Never  Vaping Use   Vaping  Use: Never used  Substance and Sexual Activity   Alcohol use: Yes    Alcohol/week: 0.0 standard drinks of alcohol    Comment: 3-4 weekends    Drug use: No   Sexual activity: Yes  Other Topics Concern   Not on file  Social History Narrative   Lives with wife, 3 children.   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    06/2022   Social Determinants of Health   Financial Resource Strain: Not on file  Food Insecurity: Not on file  Transportation Needs: Not on file  Physical Activity: Not on file  Stress: Not on file  Social Connections: Not on file  Intimate Partner Violence: Not on file     Review of Systems: General: negative for  chills, fever, night sweats or weight changes.  Cardiovascular: negative for chest pain, dyspnea on exertion, edema, orthopnea, palpitations, paroxysmal nocturnal dyspnea or shortness of breath Dermatological: negative for rash Respiratory: negative for cough or wheezing Urologic: negative for hematuria Abdominal: negative for nausea, vomiting, diarrhea, bright red blood per rectum, melena, or hematemesis Neurologic: negative for visual changes, syncope, or dizziness All other systems reviewed and are otherwise negative except as noted above.    Blood pressure 132/88, pulse 72, height 6' 6"$  (1.981 m), weight (!) 400 lb 3.2 oz (181.5 kg).  General appearance: alert and no distress Neck: no adenopathy, no carotid bruit, no JVD, supple, symmetrical, trachea midline, and thyroid not enlarged, symmetric, no tenderness/mass/nodules Lungs: clear to auscultation bilaterally Heart: regular rate and rhythm, S1, S2 normal, no murmur, click, rub or gallop Extremities: extremities normal, atraumatic, no cyanosis or edema Pulses: 2+ and symmetric Skin: Skin color, texture, turgor normal. No rashes or lesions Neurologic: Grossly normal  EKG sinus rhythm at 71 with right bundle branch block.  I personally reviewed this EKG.  ASSESSMENT AND PLAN:   Hypertension associated with diabetes ([place]) History of essential potential blood pressure measured at 132/88.  He does take his olmesartan every other day.  History of pulmonary embolus (PE) History of pulmonary embolism in 2014 and again April 2022.  He was on Xarelto but stopped it 5 months ago on his own.  Morbid obesity ([place]) Morbid obesity with a BMI of 46.  He is currently being seen at a weight loss center.  OSA (obstructive sleep apnea) History of obstructive sleep apnea on BiPAP  Hyperlipidemia associated with type 2 diabetes mellitus ([place]) History of hyperlipidemia on atorvastatin when she is taking every other day now.  His most recent  lipid profile performed 01/18/2023 revealed total cholesterol of 158, LDL of 86 and HDL of 50, not at goal for secondary prevention given his elevated coronary calcium score of 181.  I told him to take his atorvastatin on a daily basis and we will recheck a lipid liver profile in 3 months.     Lorretta Harp MD FACP,FACC,FAHA, [facility] 02/03/2023 12:00 PM

## 2023-02-03 NOTE — Assessment & Plan Note (Signed)
Morbid obesity with a BMI of 46.  He is currently being seen at a weight loss center.

## 2023-02-03 NOTE — Assessment & Plan Note (Signed)
History of pulmonary embolism in 2014 and again April 2022.  He was on Xarelto but stopped it 5 months ago on his own.

## 2023-02-03 NOTE — Assessment & Plan Note (Signed)
History of essential potential blood pressure measured at 132/88.  He does take his olmesartan every other day.

## 2023-02-03 NOTE — Assessment & Plan Note (Signed)
History of obstructive sleep apnea on BiPAP 

## 2023-02-04 NOTE — Addendum Note (Signed)
Addended by: Amalia Hailey on: 02/04/2023 02:58 PM   Modules accepted: Orders

## 2023-03-31 ENCOUNTER — Other Ambulatory Visit: Payer: Self-pay | Admitting: Medical

## 2023-03-31 DIAGNOSIS — R600 Localized edema: Secondary | ICD-10-CM

## 2023-05-27 LAB — HM DIABETES EYE EXAM

## 2023-05-31 ENCOUNTER — Encounter: Payer: Self-pay | Admitting: Internal Medicine

## 2024-01-30 LAB — HEPATIC FUNCTION PANEL
ALT: 20 U/L (ref 10–40)
AST: 16 (ref 14–40)
Alkaline Phosphatase: 81 (ref 25–125)

## 2024-01-30 LAB — CBC: RBC: 5.43 — AB (ref 3.87–5.11)

## 2024-01-30 LAB — LIPID PANEL
Cholesterol: 162 (ref 0–200)
HDL: 46 (ref 35–70)
LDL Cholesterol: 97
LDl/HDL Ratio: 3.5
Triglycerides: 102 (ref 40–160)

## 2024-01-30 LAB — BASIC METABOLIC PANEL
CO2: 24 — AB (ref 13–22)
Chloride: 105 (ref 99–108)
Creatinine: 1.3 (ref 0.6–1.3)
Glucose: 113
Potassium: 4 meq/L (ref 3.5–5.1)
Sodium: 139 (ref 137–147)

## 2024-01-30 LAB — CBC AND DIFFERENTIAL
HCT: 48 (ref 41–53)
Hemoglobin: 16 (ref 13.5–17.5)
Platelets: 234 10*3/uL (ref 150–400)
WBC: 6.5

## 2024-01-30 LAB — PSA: PSA: 1.13

## 2024-01-30 LAB — TSH: TSH: 4.21 (ref 0.41–5.90)

## 2024-01-30 LAB — COMPREHENSIVE METABOLIC PANEL: Calcium: 8.8 (ref 8.7–10.7)

## 2024-01-31 LAB — HEMOGLOBIN A1C: Hemoglobin A1C: 5.3

## 2024-02-01 ENCOUNTER — Ambulatory Visit: Payer: 59 | Admitting: Medical

## 2024-02-01 VITALS — BP 122/80 | HR 84 | Wt 381.8 lb

## 2024-02-01 DIAGNOSIS — R609 Edema, unspecified: Secondary | ICD-10-CM | POA: Diagnosis not present

## 2024-02-01 DIAGNOSIS — Z86718 Personal history of other venous thrombosis and embolism: Secondary | ICD-10-CM

## 2024-02-01 DIAGNOSIS — E785 Hyperlipidemia, unspecified: Secondary | ICD-10-CM

## 2024-02-01 DIAGNOSIS — R0989 Other specified symptoms and signs involving the circulatory and respiratory systems: Secondary | ICD-10-CM

## 2024-02-01 DIAGNOSIS — E1159 Type 2 diabetes mellitus with other circulatory complications: Secondary | ICD-10-CM | POA: Diagnosis not present

## 2024-02-01 DIAGNOSIS — E1169 Type 2 diabetes mellitus with other specified complication: Secondary | ICD-10-CM

## 2024-02-01 DIAGNOSIS — I75023 Atheroembolism of bilateral lower extremities: Secondary | ICD-10-CM

## 2024-02-01 DIAGNOSIS — I152 Hypertension secondary to endocrine disorders: Secondary | ICD-10-CM

## 2024-02-01 DIAGNOSIS — Z79899 Other long term (current) drug therapy: Secondary | ICD-10-CM | POA: Diagnosis not present

## 2024-02-01 DIAGNOSIS — I8390 Asymptomatic varicose veins of unspecified lower extremity: Secondary | ICD-10-CM | POA: Insufficient documentation

## 2024-02-01 MED ORDER — POTASSIUM CHLORIDE ER 10 MEQ PO TBCR: 10.0000 meq | EXTENDED_RELEASE_TABLET | Freq: Every day | ORAL | 2 refills | Status: DC

## 2024-02-01 NOTE — Progress Notes (Signed)
Subjective:  Dean [name] is a 63 y.o. male who presents for Chief Complaint  Patient presents with   Diabetes    Diabetes and feet are swelling x a couple months. Been seeing Helane Rima at [facility] every 6 weeks. Had labs done yesterday- in care everywhere. Had bloodclots  in back of legs but feels like its back      Here for concerns.    He has not back to our office since July of  2023.  He has been seeing [facility] every 6 weeks, just saw them yesterday and had labs.     He has a history of diabetes, hyperlipidemia, hypertension, hypergonadism, history of pulmonary embolism, history of prostate cancer, history of aortic atherosclerosis.  Lately he has been noticing feet swelling for couple months ,and swelling behind knees.  Staying sore all the time behind knees.  Not eating much salty foods.  No trouble breathing.  No chest pain, no DOE.   Exercise with walking few days per week.  Has had recent travel.  No recent injury or trauma.   No recent surgery.  Has hx/o DVT 2022 thought to be triggered by hormone therapy for prostate cancer, and first time in 2015, happened 2 weeks after testosterone pellets were put in.  Last on blood thinners in 2022.  He ultimately stopped blood thinners along with other medicaiton as he was "way too much medication."  Diabetes-he is on Mounjaro 15 mg weekly.  Been on mounjaro since for about a year ago.  He is on phentermine per Dr. Earlene Plater.  Been on this 6 months.  Hyperlipidemia-taking atorvastatin 40 mg daily  Been using Lasix 20 mg daily for foot swelling.  That seems to helps.  Takes Flomax 0.4 mg daily  Uses compression hose relatively often.  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  No other aggravating or relieving factors.    No other c/o.  Past Medical History:  Diagnosis Date   Aortic atherosclerosis (HCC) 04/13/2021   Back pain    Controlled type 2 diabetes mellitus without complication,  without long-term current use of insulin (HCC) 07/06/2016   Elevated PSA    H/O blood clots    Hyperlipidemia    Hypertension    Hypogonadism in male    Joint pain    Lower extremity edema    Morbid obesity (HCC)    New onset type 2 diabetes mellitus (HCC) 07/06/2016   PE (pulmonary embolism) 2014   question testosterone pellets as cause   Prostate cancer (HCC)    Sleep apnea    SOB (shortness of breath)    Current Outpatient Medications on File Prior to Visit  Medication Sig Dispense Refill   atorvastatin (LIPITOR) 40 MG tablet Take 1 tablet (40 mg total) by mouth daily. 90 tablet 3   Multiple Vitamin (MULTIVITAMIN) tablet Take 1 tablet by mouth daily.     phentermine (ADIPEX-P) 37.5 MG tablet Take 1/2-1 tablet Orally Once a day for 90 days     tamsulosin (FLOMAX) 0.4 MG CAPS capsule Take 0.4 mg by mouth at bedtime.     tirzepatide (MOUNJARO) 15 MG/0.5ML Pen Inject 15 mg into the skin once a week. 2 mL 3   olmesartan (BENICAR) 20 MG tablet Take 1 tablet (20 mg total) by mouth daily. (Patient not taking: Reported on 02/01/2024) 90 tablet 3   No current facility-administered medications on file prior to visit.  The following portions of the patient's history were reviewed and updated as appropriate: allergies, current medications, past family history, past medical history, past social history, past surgical history and problem list.  ROS Otherwise as in subjective above     Objective: BP 122/80   Pulse 84   Wt (!) 381 lb 12.8 oz (173.2 kg)   BMI 44.12 kg/m   Wt Readings from Last 3 Encounters:  02/01/24 (!) 381 lb 12.8 oz (173.2 kg)  02/03/23 (!) 400 lb 3.2 oz (181.5 kg)  07/06/22 (!) 388 lb 3.2 oz (176.1 kg)   BP Readings from Last 3 Encounters:  02/01/24 122/80  02/03/23 132/88  07/06/22 110/70    General appearance: alert, no distress, well developed, well nourished Neck: supple, no lymphadenopathy, no thyromegaly, no masses, no JVD or bruits Heart: tachy  around 100, otherwise RRR, normal S1, S2, no murmurs Lungs: CTA bilaterally, no wheezes, rhonchi, or rales Pulses: 2+ radial pulses, 1+ pedal pulses, normal cap refill Ext: Mild to moderate varicose veins of left lower extremity, both feet appear somewhat purplish but cap refill normal, there is 1+ nonpitting lower extremity edema bilaterally, no obvious asymmetry of either leg, there is slight tenderness in the right popliteal region laterally, negative Homans  EKG reviewed   Assessment: Encounter Diagnoses  Name Primary?   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  We discussed possible causes.  We discussed that he is on a high risk medicine with phentermine daily for the last 6 months.  This can put extra strain on the heart and he notes that his pulse rate runs around 100 most days.  We discussed that he may need to limit that medicine  We discussed possible causes of leg swelling.  He does travel a lot, he has varicose veins.  He probably has some chronic venous insufficiency as well  We discussed ABI screening.  Referral placed.   Given his history of DVT, we discussed venous ultrasound to evaluate for DVT as a possible test.  He declines for now.  I do not have a strong suspicion of DVT today.   I will check a BNP level  He recently changed from Lasix to torsemide a month ago.  Advised that he can use 1 tablet daily or go up to 1-1/2 or 2 tablets daily when swelling is worse.   Prescribed potassium today if he starts taking more than 1 dose of torsemide a day he will add potassium as well.  Continue compression hose.  Discussed role of exercise, limiting salt, avoiding prolonged standing or sitting.  If any worsening changes with legs in the next few days concerned about DVT let me know and we will place an order for ultrasound  Counseled on possibly limiting phentermine or not using that medicine going forward.  Discussed risk and benefits.  Burech "Ron" was seen today for diabetes.  Diagnoses and all orders for this visit:  Edema, unspecified type -  Brain natriuretic peptide -     EKG 12-Lead -     VAS [place] ABI WITH/WO TBI; Future  Hypertension associated with diabetes (HCC) -     Brain natriuretic peptide -     EKG 12-Lead  High risk medication use -     Brain natriuretic peptide -     EKG 12-Lead  History of DVT (deep vein thrombosis)  Hyperlipidemia associated with type 2 diabetes mellitus (HCC)  Type 2 diabetes mellitus with other specified complication, without long-term current use of insulin (HCC)  Varicose veins of lower extremity, unspecified laterality, unspecified whether complicated -     VAS [place] ABI WITH/WO TBI; Future  Decreased pedal pulses -     VAS [place] ABI WITH/WO TBI; Future  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    Follow up: pending lab

## 2024-02-02 LAB — BRAIN NATRIURETIC PEPTIDE: BNP: 6.9 pg/mL (ref 0.0–100.0)

## 2024-02-02 NOTE — Progress Notes (Signed)
Results sent through MyChart

## 2024-02-06 ENCOUNTER — Ambulatory Visit (HOSPITAL_COMMUNITY)
Admission: RE | Admit: 2024-02-06 | Discharge: 2024-02-06 | Disposition: A | Discharge status: Home / Self Care | Payer: 59 | Source: Ambulatory Visit | Attending: Medical | Admitting: Medical

## 2024-02-06 DIAGNOSIS — R609 Edema, unspecified: Secondary | ICD-10-CM | POA: Insufficient documentation

## 2024-02-06 DIAGNOSIS — I8390 Asymptomatic varicose veins of unspecified lower extremity: Secondary | ICD-10-CM | POA: Insufficient documentation

## 2024-02-06 DIAGNOSIS — R0989 Other specified symptoms and signs involving the circulatory and respiratory systems: Secondary | ICD-10-CM | POA: Diagnosis not present

## 2024-02-06 DIAGNOSIS — I75023 Atheroembolism of bilateral lower extremities: Secondary | ICD-10-CM | POA: Insufficient documentation

## 2024-02-06 LAB — VAS US ABI WITH/WO TBI
Left ABI: 1.18
Right ABI: 1.19

## 2024-02-06 NOTE — Progress Notes (Signed)
 Results sent through MyChart

## 2024-05-03 ENCOUNTER — Other Ambulatory Visit: Payer: Self-pay | Admitting: Medical

## 2024-06-27 ENCOUNTER — Encounter: Payer: Self-pay | Admitting: Medical

## 2024-06-27 ENCOUNTER — Ambulatory Visit: Payer: Self-pay | Admitting: Medical

## 2024-06-27 LAB — HM DIABETES EYE EXAM

## 2024-06-27 NOTE — Progress Notes (Signed)
Abstract eye exam

## 2024-11-27 ENCOUNTER — Ambulatory Visit: Admitting: Medical

## 2024-11-27 VITALS — BP 120/80 | HR 70 | Wt 384.2 lb

## 2024-11-27 DIAGNOSIS — R202 Paresthesia of skin: Secondary | ICD-10-CM | POA: Diagnosis not present

## 2024-11-27 DIAGNOSIS — Z8546 Personal history of malignant neoplasm of prostate: Secondary | ICD-10-CM | POA: Diagnosis not present

## 2024-11-27 DIAGNOSIS — Z13220 Encounter for screening for lipoid disorders: Secondary | ICD-10-CM

## 2024-11-27 DIAGNOSIS — I8390 Asymptomatic varicose veins of unspecified lower extremity: Secondary | ICD-10-CM

## 2024-11-27 DIAGNOSIS — E1169 Type 2 diabetes mellitus with other specified complication: Secondary | ICD-10-CM | POA: Diagnosis not present

## 2024-11-27 DIAGNOSIS — E1159 Type 2 diabetes mellitus with other circulatory complications: Secondary | ICD-10-CM | POA: Diagnosis not present

## 2024-11-27 DIAGNOSIS — I700 Atherosclerosis of aorta: Secondary | ICD-10-CM

## 2024-11-27 DIAGNOSIS — Z23 Encounter for immunization: Secondary | ICD-10-CM | POA: Diagnosis not present

## 2024-11-27 DIAGNOSIS — M25562 Pain in left knee: Secondary | ICD-10-CM

## 2024-11-27 DIAGNOSIS — M2550 Pain in unspecified joint: Secondary | ICD-10-CM | POA: Diagnosis not present

## 2024-11-27 DIAGNOSIS — M25561 Pain in right knee: Secondary | ICD-10-CM | POA: Diagnosis not present

## 2024-11-27 DIAGNOSIS — I152 Hypertension secondary to endocrine disorders: Secondary | ICD-10-CM | POA: Diagnosis not present

## 2024-11-27 DIAGNOSIS — G8929 Other chronic pain: Secondary | ICD-10-CM

## 2024-11-27 NOTE — Patient Instructions (Addendum)
 Please go to [facility] for your knee xrays.   Their hours are 8am - 4:30 pm Monday - Friday.  Take your insurance card with you.  [facility] [phone]   [address] [place], [place] [address]

## 2024-11-27 NOTE — Progress Notes (Signed)
 Subjective: Chief Complaint  Patient presents with   Acute Visit    Joint pain in knees. Would like some lab work done    History of Present Illness Dean [name] is a 63 year old male with prostate cancer who presents with worsening knee pain and requests blood work.  He sees another office for routine primary care as well.  He also has specialist including urologist he sees every 6 months.  He is also seeing a weight management clinic.  He has been experiencing significant joint pain, particularly in his knees, which has worsened over the past six months. The pain is severe enough to make walking upstairs difficult, though walking on level surfaces does not cause much discomfort. The pain is primarily located on the lateral aspect of the knees, with tightness in the iliotibial bands. No recent falls or injuries have occurred, and he has not had knee x-rays recently. He has not undergone knee surgery but recalls a past meniscus issue. He is not currently taking any regular medication for his knee pain but mentions that Benadryl seems to provide some relief.  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 He has not seen an orthopedic specialist recently but recalls seeing someone in the [facility] in the past. He notes occasional swelling in his feet and legs.  He has a history of prostate cancer, diagnosed in 2020, for which he received radiation treatment. He continues to monitor his PSA levels with his urologist in [place], whom he sees every six months. His last PSA was 1.1, ten months ago.  He request PSA blood test today.  He expresses concern about potential joint issues related to his family history, noting that his sisters have joint problems, though he is unsure if they have rheumatoid arthritis. He reports numbness in three fingers and swelling in his feet and legs, but no swelling in his hands.  He  wants some the labs for this.  He is interested in having blood work done, including cholesterol and diabetes markers, as he believes his joint pain has worsened significantly this year. He had a comprehensive medical panel in February, which showed normal results for diabetes, PSA, thyroid, BNP, blood count, and cholesterol.  No other aggravating or relieving factors. No other complaint.  Past Medical History:  Diagnosis Date   Aortic atherosclerosis 04/13/2021   Back pain    Controlled type 2 diabetes mellitus without complication, without long-term current use of insulin (HCC) 07/06/2016   Elevated PSA    H/O blood clots    Hyperlipidemia    Hypertension    Hypogonadism in male    Joint pain    Lower extremity edema    Morbid obesity (HCC)    New onset type 2 diabetes mellitus (HCC) 07/06/2016   PE (pulmonary embolism) 2014   question testosterone  pellets as cause   Prostate cancer (HCC)    Sleep apnea    SOB (shortness of breath)    Current Outpatient Medications on File Prior to Visit  Medication Sig Dispense Refill   atorvastatin  (LIPITOR) 40 MG tablet Take 1 tablet (40 mg total) by mouth daily. 90 tablet 3   Multiple Vitamin (MULTIVITAMIN) tablet Take 1 tablet by mouth daily.     olmesartan  (BENICAR ) 20 MG tablet Take 1 tablet (20 mg total) by mouth daily. 90 tablet 3   phentermine  (ADIPEX-P ) 37.5 MG tablet Take 1/2-1 tablet Orally Once a  day for 90 days     potassium chloride  (KLOR-CON ) 10 MEQ tablet TAKE 1 TABLET BY MOUTH EVERY DAY 90 tablet 0   tamsulosin (FLOMAX) 0.4 MG CAPS capsule Take 0.4 mg by mouth at bedtime.     tirzepatide  (MOUNJARO ) 15 MG/0.5ML Pen Inject 15 mg into the skin once a week. 2 mL 3   torsemide (DEMADEX) 20 MG tablet Take 20 mg by mouth daily.     No current facility-administered medications on file prior to visit.   Family History  Problem Relation Age of Onset   Hypertension Mother    Hyperlipidemia Father    Hypertension Father    Heart  disease Father        62   Cancer Father    Dementia Father        47   Sleep apnea Father    Prostate cancer Brother 52   Past Surgical History:  Procedure Laterality Date   FOREARM SURGERY Left    JOINT REPLACEMENT Right    x 2 , right shoulder   SHOULDER SURGERY Right    WRIST SURGERY Right    ROS as in subjective    Objective: BP 120/80   Pulse 70   Wt (!) 384 lb 3.2 oz (174.3 kg)   SpO2 100%   BMI 44.40 kg/m   Gen: Well-developed, well-nourished, no acute distress Moderate varicose veins of legs particularly on the left lower leg more so compared to the right No obvious lower extremity edema 2+ lower extremity pulses Bilateral knees with bony arthritic changes but no effusion, no obvious laxity, somewhat tender at the insertion of IT band bilaterally, bilateral hips with decreased internal range of motion    Assessment and Plan Encounter Diagnoses  Name Primary?   Chronic pain of both knees Yes   Needs flu shot    Polyarthralgia    Type 2 diabetes mellitus with other specified complication, without long-term current use of insulin (HCC)    Paresthesia    History of prostate cancer    Screening for lipid disorders    Aortic atherosclerosis    Varicose veins of lower extremity, unspecified laterality, unspecified whether complicated    Hypertension associated with diabetes (HCC)    Hyperlipidemia associated with type 2 diabetes mellitus (HCC)    We discussed his multiple concerns  We discussed that he is not scheduled for a well visit today so we could not do a complete panel of labs.  He also had a fairly complete panel blood work earlier this year at another office which was reviewed today   Chronic knee pain and polyarthralgia Chronic knee pain worsened over six months, likely due to arthritis or patellar malalignment. Family history of joint issues noted. No significant swelling, but tightness in internal range of motion bilaterally. - Ordered knee  x-rays to assess arthritis and joint space narrowing. - Consider referral to physical therapy if x-ray shows mild arthritis and pain is primarily with stairs. - Discussed glucosamine, chondroitin, fish oil supplements, and topical creams for joint pain. - Ordered sed rate and rheumatoid factor to evaluate for inflammatory arthritis.  History of prostate cancer Prostate cancer treated with radiation in 2020, currently cancer-free. Regular urologist follow-ups with PSA monitoring. - Ordered PSA test today.  General Health Maintenance Routine health maintenance discussed. Recent labs normal. Emphasized importance of regular screenings and lab work. - Administered flu shot today.  Paresthesias-labs as below  History of diabetes, hypertension, hyperlipidemia-this is being managed at a  different clinic apparently  Labs as below  Anas Reister was seen today for acute visit.  Diagnoses and all orders for this visit:  Chronic pain of both knees -     DG Knee Complete 4 Views Left; Future -     DG Knee Complete 4 Views Right; Future -     Sedimentation rate -     Rheumatoid factor -     CYCLIC CITRUL PEPTIDE ANTIBODY, IGG/IGA -     Uric acid -     ANA  Needs flu shot -     Flu vaccine trivalent PF, 6mos and older(Flulaval,Afluria,Fluarix,Fluzone)  Polyarthralgia -     Sedimentation rate -     Rheumatoid factor -     CYCLIC CITRUL PEPTIDE ANTIBODY, IGG/IGA -     Uric acid -     ANA  Type 2 diabetes mellitus with other specified complication, without long-term current use of insulin (HCC) -     Hemoglobin A1c  Paresthesia -     Vitamin B12  History of prostate cancer -     PSA  Screening for lipid disorders -     Lipid panel  Aortic atherosclerosis  Varicose veins of lower extremity, unspecified laterality, unspecified whether complicated  Hypertension associated with diabetes (HCC)  Hyperlipidemia associated with type 2 diabetes mellitus (HCC)    F/u pending labs,  xrays

## 2024-11-28 LAB — SEDIMENTATION RATE: Sed Rate: 5 mm/h (ref 0–30)

## 2024-11-28 LAB — RHEUMATOID FACTOR: Rheumatoid fact SerPl-aCnc: <10 [IU]/mL (ref <14.0)

## 2024-11-28 LAB — LIPID PANEL
Chol/HDL Ratio: 3.7 ratio (ref 0.0–5.0)
Cholesterol, Total: 183 mg/dL (ref 100–199)
HDL: 49 mg/dL (ref >39)
LDL Chol Calc (NIH): 112 mg/dL — ABNORMAL HIGH (ref 0–99)
Triglycerides: 124 mg/dL (ref 0–149)
VLDL Cholesterol Cal: 22 mg/dL (ref 5–40)

## 2024-11-28 LAB — PSA: Prostate Specific Ag, Serum: 1.1 ng/mL (ref 0.0–4.0)

## 2024-11-28 LAB — HEMOGLOBIN A1C
Est. average glucose Bld gHb Est-mCnc: 117 mg/dL
Hgb A1c MFr Bld: 5.7 % — ABNORMAL HIGH (ref 4.8–5.6)

## 2024-11-28 LAB — CYCLIC CITRUL PEPTIDE ANTIBODY, IGG/IGA: Cyclic Citrullin Peptide Ab: 8 U (ref 0–19)

## 2024-11-28 LAB — ANA: Anti Nuclear Antibody (ANA): POSITIVE — AB

## 2024-11-28 LAB — URIC ACID: Uric Acid: 7.1 mg/dL (ref 3.8–8.4)

## 2024-11-28 LAB — VITAMIN B12: Vitamin B-12: 465 pg/mL (ref 232–1245)

## 2024-11-29 ENCOUNTER — Ambulatory Visit: Payer: Self-pay | Admitting: Medical

## 2024-11-29 DIAGNOSIS — G4733 Obstructive sleep apnea (adult) (pediatric): Secondary | ICD-10-CM

## 2024-11-29 NOTE — Progress Notes (Signed)
 Please send copy of labs to urology and send copy of labs and note to his other doctor who manages his medications  Results through MyChart

## 2024-12-03 ENCOUNTER — Ambulatory Visit: Payer: Self-pay

## 2024-12-03 NOTE — Telephone Encounter (Signed)
 FYI Only or Action Required?: Action required by provider: referral request.  Patient was last seen in primary care on 11/27/2024 by Bulah Alm RAMAN, PA-C.  Called Nurse Triage reporting Release of Information.  Symptoms began today.  Interventions attempted: Nothing.  Symptoms are: unchanged.  Triage Disposition: No disposition on file.  Patient/caregiver understands and will follow disposition?:    Copied from CRM #[id number]. Topic: Clinical - Red Word Triage >> Dec 03, 2024  2:30 PM Dean [name] wrote: Red Word that prompted transfer to Nurse Triage: Bypap machine that needs to be replaced. Not functioning. Per insurance needs authorization Answer Assessment - Initial Assessment Questions 1. REASON FOR CALL: What is the main reason for your call? or How can I best help you?     Patient called requesting orders for new Bipap machine. Patient reports current Bipap is malfunctioning. Nurse instructed patient to contact 1-800 number; patient located number and reports will call.  2. SYMPTOMS : Do you have any symptoms?      Patient denies any symptoms or concerns  Protocols used: Information Only Call - No Triage-A-AH

## 2024-12-03 NOTE — Telephone Encounter (Signed)
 Call disconnected prior to warm transfer. Nurse will attempt to contact patient.

## 2024-12-03 NOTE — Telephone Encounter (Signed)
 See my chart message

## 2024-12-24 ENCOUNTER — Encounter: Payer: Self-pay | Admitting: Family Medicine

## 2024-12-24 ENCOUNTER — Ambulatory Visit: Admitting: Family Medicine

## 2024-12-24 VITALS — BP 118/76 | HR 85 | Ht 78.0 in | Wt 377.2 lb

## 2024-12-24 DIAGNOSIS — R7689 Other specified abnormal immunological findings in serum: Secondary | ICD-10-CM

## 2024-12-24 DIAGNOSIS — E66813 Obesity, class 3: Secondary | ICD-10-CM

## 2024-12-24 DIAGNOSIS — M48061 Spinal stenosis, lumbar region without neurogenic claudication: Secondary | ICD-10-CM

## 2024-12-24 DIAGNOSIS — Z6841 Body Mass Index (BMI) 40.0 and over, adult: Secondary | ICD-10-CM

## 2024-12-24 DIAGNOSIS — M170 Bilateral primary osteoarthritis of knee: Secondary | ICD-10-CM

## 2024-12-24 NOTE — Progress Notes (Signed)
 "     Diagnoses and Orders:   1. Positive ANA (antinuclear antibody)   2. Spinal stenosis of lumbar region, unspecified whether neurogenic claudication present   3. Bilateral primary osteoarthritis of knee   4. Class 3 severe obesity due to excess calories with serious comorbidity and body mass index (BMI) of 40.0 to 44.9 in adult [facility])    Meds ordered this encounter  Medications   meloxicam  (MOBIC ) 15 MG tablet    Sig: Take 1 tablet (15 mg total) by mouth daily.    Dispense:  30 tablet    Refill:  3   cyclobenzaprine  (FLEXERIL ) 10 MG tablet    Sig: Take 1 tablet (10 mg total) by mouth daily as needed for muscle spasms.    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 Pain is primarily lateral from hip to knee. Previous injections provided temporary relief. No current anti-inflammatory medication use. - Referred to sports medicine for possible knee injections and evaluation. - Prescribed meloxicam  15 mg daily with food for anti-inflammatory effect. - Prescribed cyclobenzaprine  for muscle relaxation. - Recommended physical therapy for IT band stretching and strengthening. - Advised against walking only for exercise as it exacerbates IT band pain.  Abnormal ANA, under evaluation Positive ANA without titers or patterns identified. Symptoms include joint pain, but no inflammatory arthritis suspected. Previous labs showed negative anti-CCP, normal uric acid, and normal sed rate. - Ordered ANA with titers to further evaluate the significance of the positive ANA.  Obesity Recent weight gain over the Christmas period. Current weight is 377 lbs. Previous  weight management included phentermine  and Mounjaro , recently switched to Ozempic without significant improvement. Emphasis on establishing a daily routine and monitoring visceral fat and body fat percentage. - Continue current weight management plan with Ozempic. - Encouraged establishment of a daily routine to aid in weight management. - Monitor visceral fat and body fat percentage.  Peripheral edema and neuropathy Reports of purple and tingling feet, and numbness in fingers, suggestive of peripheral edema and neuropathy. Symptoms may be related to fluid retention/lumbar stenosis. - Advised daily use of diuretic to manage fluid retention.  Dean Shutter, DO, MS, FAAFP, Dipl. [facility] [address] [place], [address] Dept: [phone] Dept Fax: [phone]  Subjective:   History of Present Illness Dean [name] is a 64 year old male who presents with joint pain and follow-up on his ANA results.  Polyarticular joint pain - Persistent joint pain, primarily in knees and other joints - Pain improved compared to previous visits but remains bothersome - No regular use of pain medications - Occasional use of Benadryl at night, which subjectively improves sleep and knee pain - No use of naproxen, ibuprofen, Aleve, or Tylenol - Concern that joint pain may indicate an underlying condition  Autoimmune serology concerns - Previous positive ANA performed by another provider - Anti-CCP antibody negative - Erythrocyte sedimentation rate (ESR) normal - No C-reactive protein (CRP) performed - Uncertainty regarding significance of positive ANA  Obesity and weight management - Current weight 377 lb, increased from 370 lb, previously 384 lb on December 9 at another  facility - Struggles with weight management - Uses Ozempic after switching from Mounjaro , with minimal perceived benefit - Occasional, not regular, use of  phentermine   Peripheral edema and neuropathy - Swelling and purple discoloration in feet - Tingling in feet - Numbness in three fingers (carpal tunnel distribution) - Uses a water pill as needed for swelling, not daily  Chronic back pain and activity intolerance - Chronic back pain with prior injections providing relief for approximately two weeks - Not exercising regularly since retirement - Occasional walking, which exacerbates IT band pain from hip to knee  Review of Systems: Negative, with the exception of above mentioned in HPI.  History:   Reviewed by clinician on day of visit: allergies, medications, problem list, medical history, surgical history, family history, social history, and previous encounter notes.  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 Lives with wife. Has three children. Enjoys being a grandfather. Played for the [employer] for 8 years - [employer], [address], [employer]. Has physical and cognitive exam q 5 years through [employer] program. Next due 2027. 12/30/2024    Medications:   Show/hide medication list[1] Allergies[2]  Objective:   BP 118/76 (BP Location: Left Arm, Patient Position: Sitting, Cuff Size: Large)   Pulse 85   Ht 6' 6 (1.981 m)   Wt (!) 377 lb 3.2 oz (171.1 kg)   SpO2 95%   BMI 43.59 kg/m   Physical Exam Vitals and nursing note reviewed.  Constitutional:      General: He is not in acute distress.    Appearance: He is well-developed.  HENT:     Head: Normocephalic and atraumatic.     Right Ear: External ear normal.     Left Ear: External ear normal.     Nose: Nose normal.  Eyes:     Conjunctiva/sclera: Conjunctivae normal.     Pupils: Pupils are equal, round, and reactive to light.  Cardiovascular:     Rate and Rhythm: Normal rate and regular rhythm.  Pulmonary:     Effort: Pulmonary effort is normal.  Abdominal:     General: Bowel sounds are normal.     Palpations:  Abdomen is soft.  Musculoskeletal:        General: Normal range of motion.     Cervical back: Neck supple.  Skin:    General: Skin is warm.  Neurological:     Mental Status: He is alert.  Psychiatric:        Behavior: Behavior normal.    Results for orders placed or performed in visit on 12/24/24  ANA w/Reflex   Collection Time: 12/24/24  2:40 PM  Result Value Ref Range   Anti Nuclear Antibody (ANA) Negative Negative    Attestations:   Patient is well-known to me from previous care setting and is establishing care in this system with me as PCP. Available records reviewed. Chart updated today with reconciliation of problem list, medications, allergies, and relevant history. Preventive care and chronic disease status reviewed.  Outside labs reviewed and will be abstracted. Portions of historical chart may remain incomplete; will update on an ongoing basis as clinically indicated.  Reviewed by clinician on day of visit: allergies, medications, problem list, medical history, surgical history, family history, social history, and previous encounter notes. Discussed the use of AI scribe software for clinical note transcription with the patient, who gave verbal consent to proceed. As the patient's primary care physician, board-certified  in Family Medicine and Obesity Medicine, I am providing ongoing, comprehensive obesity care based on the pillars of obesity medicine, including nutrition therapy, physical activity, behavioral modification, and pharmacologic treatment.      [1]  Outpatient Medications Prior to Visit  Medication Sig   atorvastatin  (LIPITOR) 40 MG tablet Take 1 tablet (40 mg total) by mouth daily.   Multiple Vitamin (MULTIVITAMIN) tablet Take 1 tablet by mouth daily.   olmesartan  (BENICAR ) 20 MG tablet Take 1 tablet (20 mg total) by mouth daily.   phentermine  (ADIPEX-P ) 37.5 MG tablet Take 1/2-1 tablet Orally Once a day for 90 days   potassium chloride  (KLOR-CON ) 10 MEQ tablet  TAKE 1 TABLET BY MOUTH EVERY DAY   tamsulosin (FLOMAX) 0.4 MG CAPS capsule Take 0.4 mg by mouth at bedtime.   torsemide (DEMADEX) 20 MG tablet Take 20 mg by mouth daily.   metoprolol  succinate (TOPROL -XL) 25 MG 24 hr tablet Take 25 mg by mouth daily.   OZEMPIC, 2 MG/DOSE, 8 MG/3ML SOPN Inject 2 mg into the skin once a week.   [DISCONTINUED] tirzepatide  (MOUNJARO ) 15 MG/0.5ML Pen Inject 15 mg into the skin once a week. (Patient not taking: Reported on 12/24/2024)   No facility-administered medications prior to visit.  [2]  Allergies Allergen Reactions   Penicillins Rash and Anaphylaxis   "

## 2024-12-25 ENCOUNTER — Ambulatory Visit: Payer: Self-pay | Admitting: Family Medicine

## 2024-12-25 LAB — ANA W/REFLEX: Anti Nuclear Antibody (ANA): NEGATIVE

## 2024-12-30 ENCOUNTER — Encounter: Payer: Self-pay | Admitting: Family Medicine

## 2024-12-30 DIAGNOSIS — M170 Bilateral primary osteoarthritis of knee: Secondary | ICD-10-CM | POA: Insufficient documentation

## 2024-12-30 MED ORDER — MELOXICAM 15 MG PO TABS: 15.0000 mg | ORAL_TABLET | Freq: Every day | ORAL | 3 refills | Status: AC

## 2024-12-30 MED ORDER — CYCLOBENZAPRINE HCL 10 MG PO TABS: 10.0000 mg | ORAL_TABLET | Freq: Every day | ORAL | 0 refills | Status: AC | PRN

## 2025-01-09 NOTE — Progress Notes (Unsigned)
 "               Odis Mace D.[facility] [address] [place] [address] Phone: [phone]   Assessment and Plan:     1. Bilateral primary osteoarthritis of knee (Primary) 2. Chronic pain of both knees -Chronic with exacerbation, initial sports medicine visit - Consistent with moderate to severe tricompartmental bilateral knee osteoarthritis and acute flare - X-ray obtained in clinic.  My interpretation: No acute fracture or dislocation.  Tricompartmental degenerative changes including decreased joint space, bone spurring - Start home exercise plan for knees -Patient elected for intra-articular CSI.  CSI may temporarily increase blood glucose in patient past medical history of DM type II -No significant relief with 10-day course of meloxicam .  Discontinue daily use and transition to using meloxicam  15 mg daily as needed for breakthrough pain.  Recommend limiting chronic NSAIDs to 1-2 doses per week to prevent long-term side effects. Use Tylenol 500 to 1000 mg tablets 2-3 times a day as needed for day-to-day pain relief.     Procedure: Knee Joint Injection Side: Bilateral Indication: Flare of osteoarthritis  Risks explained and consent was given verbally. The site was cleaned with alcohol prep. A needle was introduced with an anterio-lateral approach. Injection given using 2mL of 1% lidocaine without epinephrine and 1mL of kenalog 40mg /ml. This was well tolerated.  Needle was removed, hemostasis achieved, and post injection instructions were explained.   Pt was advised to call or return to clinic if these symptoms worsen or fail to improve as anticipated.    15 additional minutes spent for educating Therapeutic Home Exercise Program.  This included exercises focusing on stretching, strengthening, with focus on eccentric aspects.   Long term goals include an improvement in range of motion, strength, endurance as well as avoiding reinjury. Patient's frequency would  include in 1-2 times a day, 3-5 times a week for a duration of 6-12 weeks. Proper technique shown and discussed handout in great detail with ATC.  All questions were discussed and answered.    Pertinent previous records reviewed include none   Follow Up: 4 weeks for reevaluation.  If no improvement or worsening of symptoms, could discuss alternative injection such as Zilretta versus HA.  Could discuss GAE procedure   Subjective:   I, Monque Haggar, am serving as a neurosurgeon for Doctor Morene Mace  Chief Complaint: bilat knee pain   HPI:   01/10/2025 Patient is a 64 year old male with bilat knee pain. Patient states pain for a couple of years ago. Hx of hamstring tear right knee. Pain is located to the IT bands. Pain when going up stairs. Intermittent tylenol and ibu and that helps a little. No numbness or tingling. Decreased ROM.  Pain radiates to the back of the knee on the left side.    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  Current Medications[1]   Objective:     Vitals:   01/10/25 0818  Pulse: 93  SpO2: 100%  Weight: (!) 389 lb (176.4 kg)  Height: 6' 6 (1.981 m)      Body mass index is 44.95 kg/m.    Physical Exam:    General:  awake, alert oriented, no acute distress nontoxic Skin: no suspicious lesions or rashes Neuro:sensation intact and strength 5/5 with no deficits, no atrophy, normal muscle tone Psych: No signs of anxiety, depression or other mood  disorder  Bilateral knee: Crepitus bilaterally Generalized bony hypertrophy of bilateral knees without deformity Mild swelling Neg fluid wave, joint milking ROM Flex 110, Ext 0 NTTP over the quad tendon, medial fem condyle, lat fem condyle, patella, patella tendon, tibial tuberostiy, fibular head, posterior fossa, pes anserine bursa, gerdy's tubercle, medial jt line, lateral jt line   Gait normal    Electronically signed by:  Odis Mace D.[facility] 8:37 AM 01/10/25     [1]  Current Outpatient Medications:    atorvastatin  (LIPITOR) 40 MG tablet, Take 1 tablet (40 mg total) by mouth daily., Disp: 90 tablet, Rfl: 3   cyclobenzaprine  (FLEXERIL ) 10 MG tablet, Take 1 tablet (10 mg total) by mouth daily as needed for muscle spasms., Disp: 30 tablet, Rfl: 0   meloxicam  (MOBIC ) 15 MG tablet, Take 1 tablet (15 mg total) by mouth daily., Disp: 30 tablet, Rfl: 3   metoprolol  succinate (TOPROL -XL) 25 MG 24 hr tablet, Take 25 mg by mouth daily., Disp: , Rfl:    Multiple Vitamin (MULTIVITAMIN) tablet, Take 1 tablet by mouth daily., Disp: , Rfl:    olmesartan  (BENICAR ) 20 MG tablet, Take 1 tablet (20 mg total) by mouth daily., Disp: 90 tablet, Rfl: 3   OZEMPIC, 2 MG/DOSE, 8 MG/3ML SOPN, Inject 2 mg into the skin once a week., Disp: , Rfl:    phentermine  (ADIPEX-P ) 37.5 MG tablet, Take 1/2-1 tablet Orally Once a day for 90 days, Disp: , Rfl:    potassium chloride  (KLOR-CON ) 10 MEQ tablet, TAKE 1 TABLET BY MOUTH EVERY DAY, Disp: 90 tablet, Rfl: 0   tamsulosin (FLOMAX) 0.4 MG CAPS capsule, Take 0.4 mg by mouth at bedtime., Disp: , Rfl:    torsemide (DEMADEX) 20 MG tablet, Take 20 mg by mouth daily., Disp: , Rfl:   "

## 2025-01-10 ENCOUNTER — Ambulatory Visit

## 2025-01-10 ENCOUNTER — Ambulatory Visit: Admitting: Sports Medicine

## 2025-01-10 VITALS — HR 93 | Ht 78.0 in | Wt 389.0 lb

## 2025-01-10 DIAGNOSIS — M170 Bilateral primary osteoarthritis of knee: Secondary | ICD-10-CM | POA: Diagnosis not present

## 2025-01-10 DIAGNOSIS — M25561 Pain in right knee: Secondary | ICD-10-CM

## 2025-01-10 DIAGNOSIS — G8929 Other chronic pain: Secondary | ICD-10-CM

## 2025-01-10 DIAGNOSIS — M25562 Pain in left knee: Secondary | ICD-10-CM | POA: Diagnosis not present

## 2025-01-10 NOTE — Patient Instructions (Signed)
-   Use meloxicam  15 mg daily as needed for breakthrough pain.  Recommend limiting chronic NSAIDs to 1-2 doses per week to prevent long-term side effects. Use Tylenol 500 to 1000 mg tablets 2-3 times a day as needed for day-to-day pain relief.    Knee HEP   4 week follow up

## 2025-01-14 ENCOUNTER — Ambulatory Visit: Payer: Self-pay | Admitting: Sports Medicine

## 2025-02-07 ENCOUNTER — Ambulatory Visit: Admitting: Sports Medicine
